# Patient Record
Sex: Male | Born: 1935 | Race: Black or African American | Hispanic: No | Marital: Married | Smoking: Current every day smoker
Health system: Southern US, Community
[De-identification: ages and names within clinical notes are randomized; demographics above are authoritative.]

## PROBLEM LIST (undated history)

## (undated) DIAGNOSIS — C801 Malignant (primary) neoplasm, unspecified: Secondary | ICD-10-CM

## (undated) HISTORY — PX: TRANSURETHRAL RESECTION OF PROSTATE: SHX73

---

## 2001-09-20 ENCOUNTER — Ambulatory Visit (HOSPITAL_COMMUNITY): Admission: RE | Admit: 2001-09-20 | Discharge: 2001-09-20 | Payer: Self-pay | Admitting: Family Medicine

## 2001-09-20 ENCOUNTER — Encounter: Payer: Self-pay | Admitting: Family Medicine

## 2002-12-02 ENCOUNTER — Encounter: Payer: Self-pay | Admitting: Family Medicine

## 2002-12-02 ENCOUNTER — Inpatient Hospital Stay (HOSPITAL_COMMUNITY): Admission: RE | Admit: 2002-12-02 | Discharge: 2002-12-14 | Payer: Self-pay | Admitting: Family Medicine

## 2002-12-06 ENCOUNTER — Encounter: Payer: Self-pay | Admitting: Internal Medicine

## 2003-07-13 ENCOUNTER — Inpatient Hospital Stay (HOSPITAL_COMMUNITY): Admission: RE | Admit: 2003-07-13 | Discharge: 2003-07-15 | Payer: Self-pay | Admitting: Urology

## 2003-08-14 ENCOUNTER — Ambulatory Visit (HOSPITAL_COMMUNITY): Admission: RE | Admit: 2003-08-14 | Discharge: 2003-08-14 | Payer: Self-pay | Admitting: Ophthalmology

## 2004-11-26 ENCOUNTER — Ambulatory Visit (HOSPITAL_COMMUNITY): Admission: RE | Admit: 2004-11-26 | Discharge: 2004-11-26 | Payer: Self-pay | Admitting: General Surgery

## 2006-06-26 ENCOUNTER — Ambulatory Visit (HOSPITAL_COMMUNITY): Admission: RE | Admit: 2006-06-26 | Discharge: 2006-06-26 | Payer: Self-pay | Admitting: Family Medicine

## 2006-07-01 ENCOUNTER — Ambulatory Visit (HOSPITAL_COMMUNITY): Admission: RE | Admit: 2006-07-01 | Discharge: 2006-07-01 | Payer: Self-pay | Admitting: Family Medicine

## 2006-08-20 ENCOUNTER — Ambulatory Visit (HOSPITAL_COMMUNITY): Admission: RE | Admit: 2006-08-20 | Discharge: 2006-08-20 | Payer: Self-pay | Admitting: *Deleted

## 2006-08-28 ENCOUNTER — Inpatient Hospital Stay (HOSPITAL_COMMUNITY): Admission: AD | Admit: 2006-08-28 | Discharge: 2006-08-30 | Payer: Self-pay | Admitting: Urology

## 2006-09-16 ENCOUNTER — Ambulatory Visit (HOSPITAL_COMMUNITY): Admission: RE | Admit: 2006-09-16 | Discharge: 2006-09-16 | Payer: Self-pay | Admitting: Family Medicine

## 2006-09-18 ENCOUNTER — Ambulatory Visit (HOSPITAL_COMMUNITY): Admission: RE | Admit: 2006-09-18 | Discharge: 2006-09-18 | Payer: Self-pay | Admitting: Family Medicine

## 2006-10-06 ENCOUNTER — Ambulatory Visit (HOSPITAL_COMMUNITY): Admission: RE | Admit: 2006-10-06 | Discharge: 2006-10-06 | Payer: Self-pay | Admitting: Family Medicine

## 2006-10-28 ENCOUNTER — Encounter (INDEPENDENT_AMBULATORY_CARE_PROVIDER_SITE_OTHER): Payer: Self-pay | Admitting: *Deleted

## 2006-10-28 ENCOUNTER — Inpatient Hospital Stay (HOSPITAL_COMMUNITY): Admission: RE | Admit: 2006-10-28 | Discharge: 2006-10-31 | Payer: Self-pay | Admitting: Urology

## 2006-12-09 ENCOUNTER — Ambulatory Visit (HOSPITAL_COMMUNITY): Admission: RE | Admit: 2006-12-09 | Discharge: 2006-12-09 | Payer: Self-pay | Admitting: Family Medicine

## 2007-03-31 ENCOUNTER — Ambulatory Visit (HOSPITAL_COMMUNITY): Admission: RE | Admit: 2007-03-31 | Discharge: 2007-03-31 | Payer: Self-pay | Admitting: Urology

## 2008-09-17 IMAGING — RF DG RETROGRADE PYELOGRAM
1 series · 10 of 10 positions shown · non-contrast
Comparison: none

HISTORY: Left hydronephrosis

[Series 1: run · 4 acquisitions, 10 frames shown]
[im 1/4]
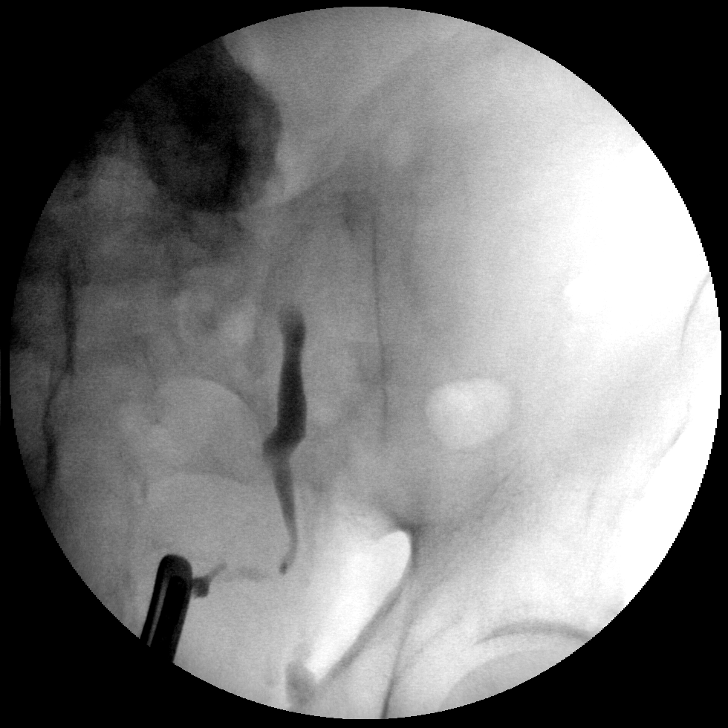
[im 1/4]
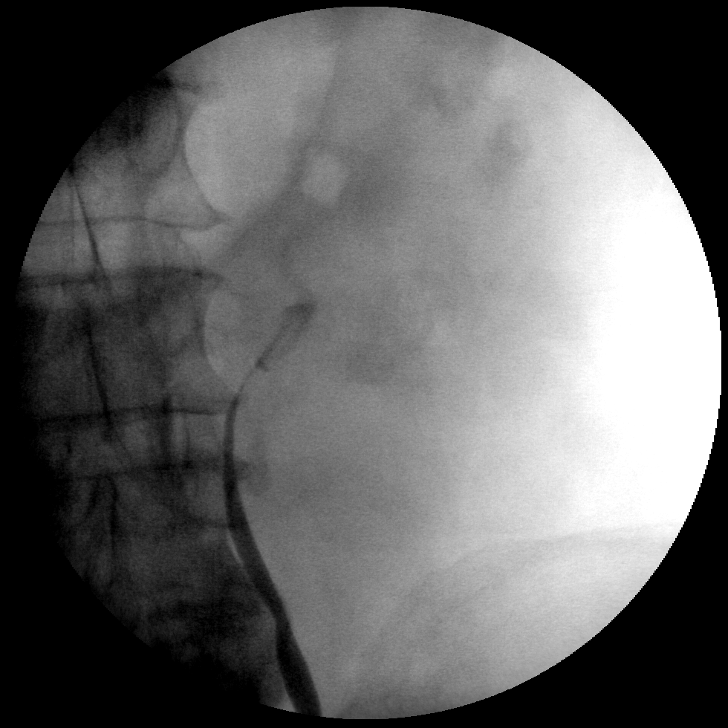
[im 1/4]
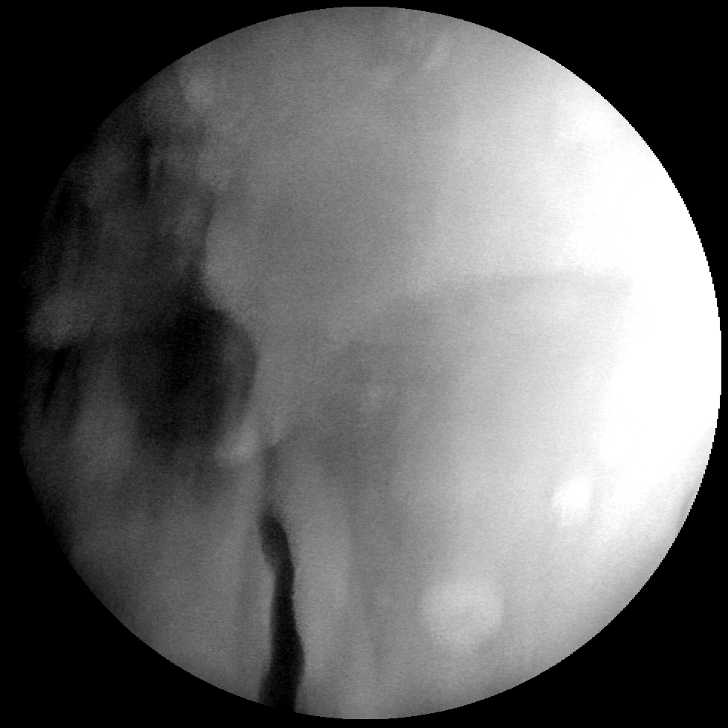
[im 1/4]
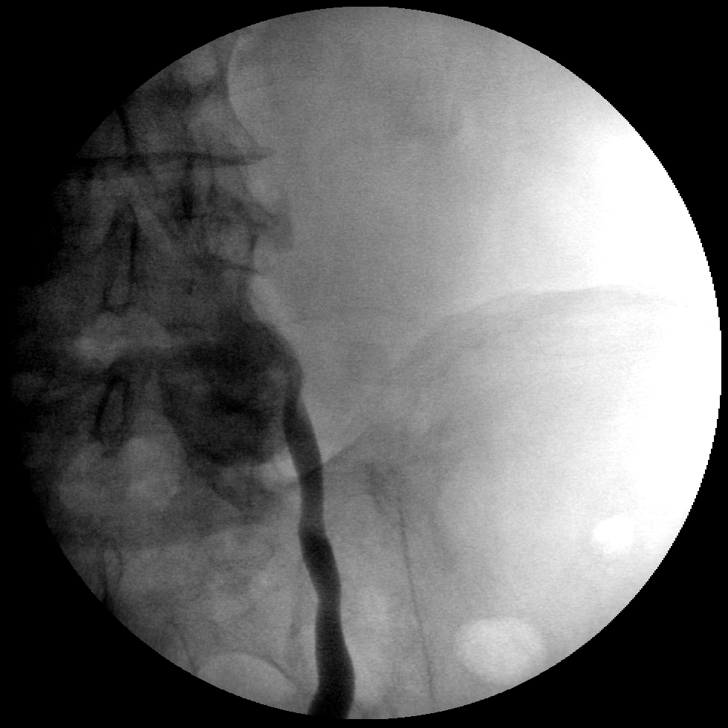
[im 2/4]
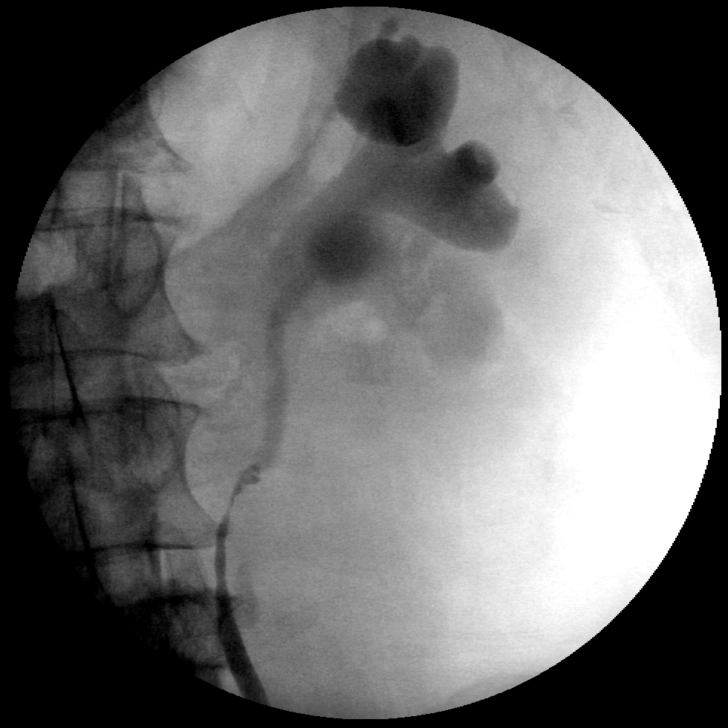
[im 2/4]
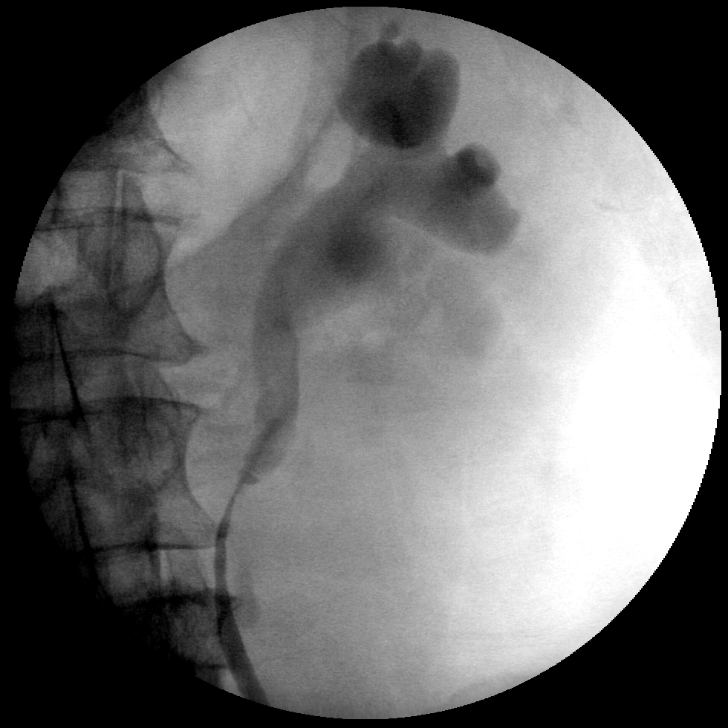
[im 2/4]
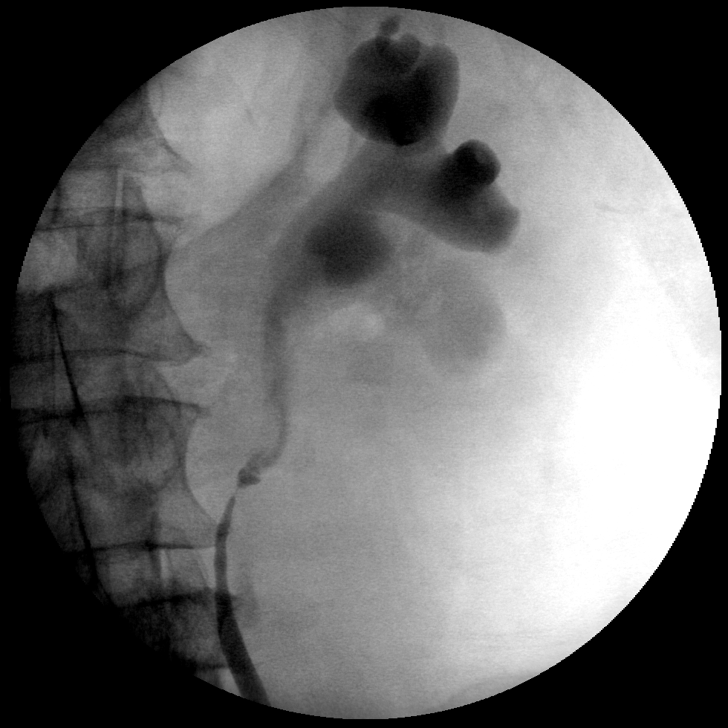
[im 2/4]
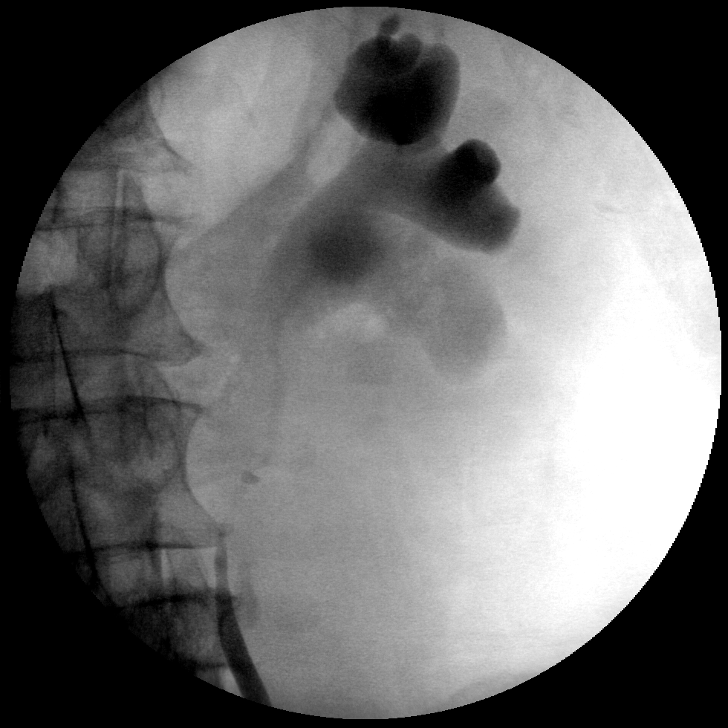
[im 3/4]
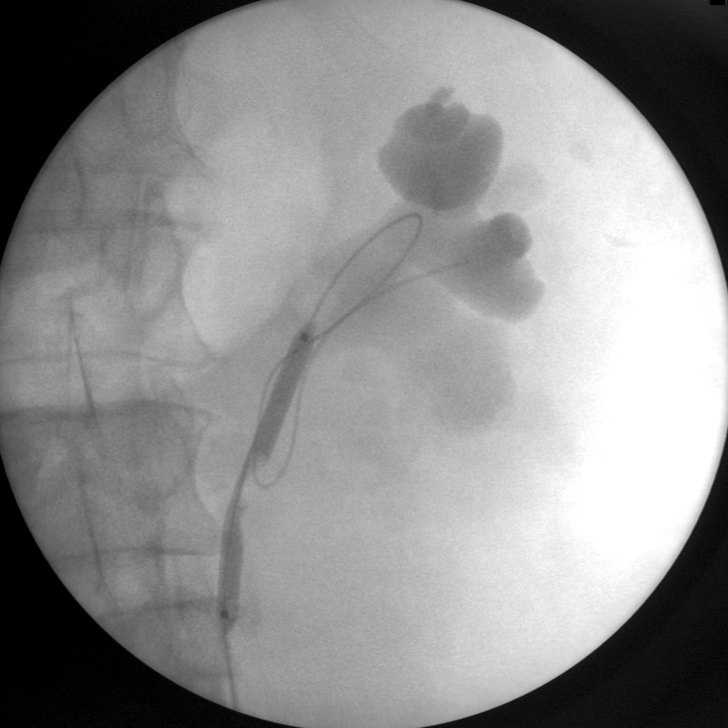
[im 4/4]
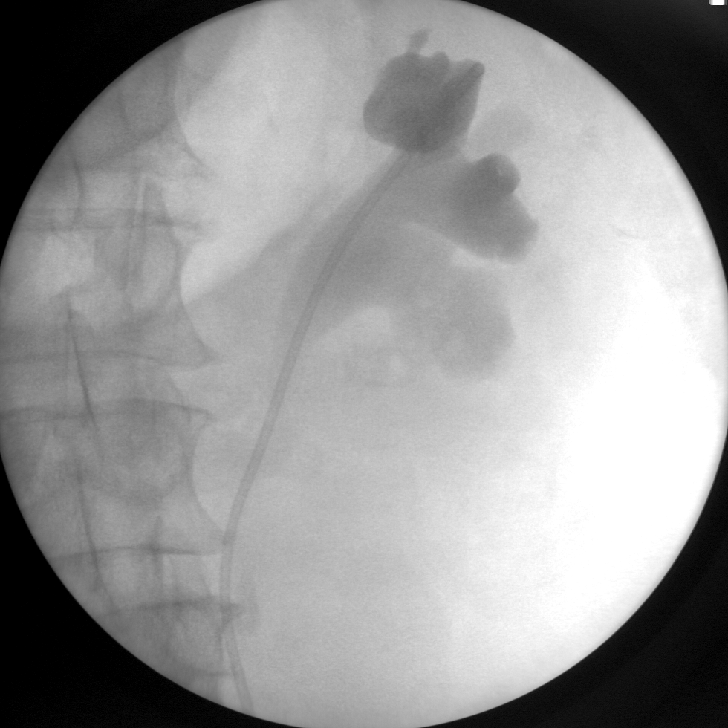

[10 of 10 positions shown; findings below may reference images not displayed]

LEFT RETROGRADE PYELOGRAPHY:

Procedure performed by Dr. Awasthi.
Left retrograde pyelography performed.

Multiple left pelvic phleboliths.
Normal caliber left ureter.
Ureters mildly indented by significant spur formation at L4-L5 level.
No ureteral stricture, wall irregularity, or persistent intraluminal filling
defect.
Left hydronephrosis present secondary to UPJ obstruction.
Images demonstrate stricture or web at the ureteropelvic junction.
This was dilated with a balloon catheter
No definite filling defect or mass seen within the significantly dilated left
renal collecting system. 
Left ureteral stent placed to upper pole of dilated left renal collecting
system.
IMPRESSION: Left UPJ obstruction and stricture which was dilated and stented.
No evidence of stone or mass.

## 2009-07-16 ENCOUNTER — Ambulatory Visit (HOSPITAL_COMMUNITY): Admission: RE | Admit: 2009-07-16 | Discharge: 2009-07-16 | Payer: Self-pay | Admitting: Family Medicine

## 2010-07-14 HISTORY — PX: INSERTION PROSTATE RADIATION SEED: SUR718

## 2010-11-26 NOTE — Discharge Summary (Signed)
NAMEWARREN, [name] NO.:  [id number]   MEDICAL RECORD NO.:  [id number]          PATIENT TYPE:  INP   LOCATION:  A301                          FACILITY:  [facility]   PHYSICIAN:  Dennie Maizes, M.D.   DATE OF BIRTH:  March 05, 1923   DATE OF ADMISSION:  10/28/2006  DATE OF DISCHARGE:  04/19/2008LH                               DISCHARGE SUMMARY   CONSULTING PHYSICIAN:  Mila Homer. Sudie Bailey, M.D.   FINAL DIAGNOSIS:  Benign prostate hypertrophy with bladder neck  obstruction, urinary retention, left hydronephrosis, recurrent urinary  tract infections.   OTHER DIAGNOSES:  1. Hypertension.  2. Hypercholesterolemia.   DISCHARGE SUMMARY:  This 77 year old male is known to me from prior  evaluation and surgery.  He had acute renal failure associated with  bilateral hydronephrosis and bladder neck obstruction in 2005.  Transverse resection of prostate was done.  He had been doing well until  a few months ago.  He seen by Dr. Sudie Bailey with urinary tract infection.  Evaluation revealed left hydronephrosis due to upper ureteral stricture.  The patient has undergone cystoscopy, left retrograde pyelogram, balloon  dilation of ureteral stricture with stent placement on August 20, 2006.  He developed urinary retention after that.  He was admitted to  the hospital with acute renal failure also with urinary retention.  He  has been on Foley catheter drainage.  Trial of voiding has been done on  several occasions and the patient had failed to void.  He was brought to  the short stay center for transverse resection of the prostate and left  ureteral stent removal.  The patient also had a CT scan of abdomen.  This revealed a mass in the left psoas area, possibly due to  inflammation.  Further follow-up with repeat CT scan has been planned.  The patient did not have any fever, chills, voiding difficulty or gross  hematuria at the time of hospitalization.   PAST MEDICAL HISTORY:  1.  History of acute renal failure with bilateral hydronephrosis in      2005.  2. Status post TURP in January 2005.  3. History of hypercholesterolemia.  4. Erectile dysfunction.  5. Hypertension.  6. Status post cystoscopy, left retrograde pyelogram balloon dilation      of the stricture and left ureteral stent placement February 2008.   MEDICATIONS:  1. Lipitor 20 mg one p.o. daily.  2. Cardura 8 mg p.o. daily.  3. Cialis p.r.n. for impotence.  4. Enteric-coated aspirin 81 mg p.o. daily which has been stopped for      the surgery.   ALLERGIES:  NONE.   PHYSICAL EXAMINATION:  HEAD, EYES, EARS, NOSE AND THROAT:  Normal.  NECK:  No masses.  LUNGS:  Clear to auscultation.  HEART:  Regular rate and rhythm.  No murmurs.  ABDOMEN:  No palpable flank mass or CVA tenderness.  Bladder is not  palpable.  GU:  Testes normal.  There is a small left hydrocele.  RECTAL:  43 g benign prostate.   COURSE IN THE HOSPITAL:  Preop labs within normal range.  The patient  was taken to the OR on October 28, 2006.  Under spinal anesthesia,  transverse resection of prostate as well as removal of left ureteral  stent were done.  The patient had an uncomplicated postoperative course.  He was seen by Dr. Sudie Bailey for follow-up and management of his medical  problems.  The first postoperative day, the urine cleared up and the  bladder irrigation was discontinued.  Postop labs revealed BUN 16,  creatinine 1.7.  WBC 8.7, hemoglobin 11.9, hematocrit 34.7.   The second postoperative day, the Foley catheter was removed.  The  patient was unable to void small amounts of urine.  He was kept under  observation.  The Foley catheter was reinserted as he had difficulty in  voiding.   The third postoperative day, the patient was doing well.  He was  afebrile with stable vital signs.  He was discharged and sent home on  October 31, 2006.  He will be reviewed in the office on November 09, 2006, at  which time a trial of  voiding will be done.  The condition of the  patient at the time of discharge is stable.  The pathology of the  prostate revealed benign prostate hyperplasia.  There was no evidence of  malignancy.   DISCHARGE MEDICATIONS:  1. Cipro 5 p.o. b.i.d. for 7 days.  2. Percocet 5/325 one p.o. nightly p.r.n. pain #20.   The patient was advised to call me for any fever, chills or bleeding.      Dennie Maizes, M.D.  Electronically Signed     SK/MEDQ  D:  12/04/2006  T:  12/04/2006  Job:  [id number]   cc:   Mila Homer. Sudie Bailey, M.D.  Fax: [phone]

## 2010-11-29 NOTE — Group Therapy Note (Signed)
NAMENALU, TROUBLEFIELD NO.:  [id number]   MEDICAL RECORD NO.:  [id number]          PATIENT TYPE:  INP   LOCATION:  A301                          FACILITY:  [facility]   PHYSICIAN:  Mila Homer. Sudie Bailey, M.D.DATE OF BIRTH:  Aug 22, 1922   DATE OF PROCEDURE:  DATE OF DISCHARGE:                                 PROGRESS NOTE   He said in general he is feeling better today.  He tells me urology that  he feels he might be able to be discharged tomorrow.   Objectively, he is somewhat recumbent in bed.  He is oriented and alert  and in no acute distress.  Well developed, well nourished.  Temperature  is 99 degrees, pulse 68, respiratory rate 20, blood pressure 121/77.  His lungs are clear throughout.  He is breathing well.  There are no  intercostal retractions.  There is no use of accessory muscles for  respirations.  The heart is now with regular rhythm without murmur.  Rate was 70.  The abdomen is soft without organomegaly or masses.  There  was no edema of the ankles.  Skin turgor is normal.  Mucous membranes  are moist.  O2 sat is 98% on 2 L and H&H are 11.9 and 34.7 with normal  white cell count and normal met 7.   ASSESSMENT:  1. Postop day 2 from transurethral resection of the prostate.  2. Benign prostatic hypertrophy.  3. Benign essentially hypertension.  4. Diabetes.  Weight and diet controlled.  5. Left ureteral stenosis, status post wound dilatation and stent      placement and now with stent removal.   PLAN:  Continue with his bladder irrigation.  All signs being good.  We  will discontinue his O2 today since his sats have been excellent and he  is not on O2 at home.      Mila Homer. Sudie Bailey, M.D.  Electronically Signed     SDK/MEDQ  D:  10/29/2006  T:  10/29/2006  Job:  [id number]

## 2010-11-29 NOTE — Discharge Summary (Signed)
NAME:  [name], [name]                         ACCOUNT NO.:  [id number]   MEDICAL RECORD NO.:  [id number]                   PATIENT TYPE:  INP   LOCATION:  A216                                 FACILITY:  [facility]   PHYSICIAN:  Hanley Hays. Dechurch, M.D.           DATE OF BIRTH:  April 28, 1923   DATE OF ADMISSION:  12/02/2002  DATE OF DISCHARGE:  12/14/2002                                 DISCHARGE SUMMARY   DISCHARGE DIAGNOSES:  1. Acute renal failure secondary to obstructive uropathy.  2. Acute pulmonary embolism.  3. Diarrhea, resolved.   DISPOSITION:  The patient discharged to home.   FOLLOW UP:  By urology 12/23/2002 as well as Dr. Sudie Bailey one month.  INR  12/16/2002 with results to Dr. Josefine Class in Dr. Michelle Nasuti absence.  Home  health nursing to assist with Foley management.  Follow up with Dr. Kristian Covey  to be arranged by patient.   MEDICATIONS:  1. Coumadin 5 mg daily.  2. Flomax 0.4 at bedtime.   DISCHARGE INSTRUCTIONS:  INR 12/16/2002.  The patient advised to avoid  nonsteroidal agents and may use Tylenol p.r.n. pain.   HOSPITAL COURSE:  The patient is a 77 year old healthy gentleman who was in  his usual state of health until several days prior to admission when he  noted some abdominal pain and diarrhea.  Several days prior to admission, he  noted decreased urinary output.  BUN at the time of admission was 70 with a  creatinine of 10.5.  CT scan performed revealed bilateral hydroureter with  hydronephrosis.  A Foley catheter was placed.  Nephrology and urology  consultations were obtained.  TSH 4.9.  His renal function returned to  normal.  On the fourth hospital day, the patient complained of some chest  pain.  D-dimer was greater than 20.  CT scan revealed grossly positive exam  with large embolus at the bifurcation of the right pulmonary artery.  Despite this finding, he actually was otherwise fairly asymptomatic.  He was  immediately treated with low-molecular weight  heparin.  Coumadin was  instituted.  His INR at the time of discharge was 2.6.  Followup as noted  above.  He is in stable condition.  The plan was discussed with his wife and  the patient.  Pharmacy provided Coumadin teaching.   PHYSICAL EXAMINATION:  GENERAL:  Well-developed, well-nourished gentleman  who appears his stated age.  VITAL SIGNS:  Blood pressure 135/70, T max 99, pulse 70s and regular,  respirations unlabored.  LUNGS:  Clear to auscultation anteriorly and posteriorly.  HEART:  Regular rate and rhythm.  No murmurs, rubs, or gallops.  ABDOMEN:  Flat, soft, nontender.  The Foley is draining clear yellow urine.  EXTREMITIES:  Without clubbing, cyanosis, or edema.  NEUROLOGIC:  Intact.   ASSESSMENT AND PLAN:  As noted above.  It should be noted that the patient  was treated with seven days of Levaquin.  Hanley Hays Josefine Class, M.D.    FED/MEDQ  D:  12/14/2002  T:  12/14/2002  Job:  [id number]   cc:   Jorja Loa, M.D.  [address]  [place]  [place] [address]  Fax: [phone]   Dennie Maizes, M.D.  [address]  [place]  [place] [address]  Fax: [phone]   Mila Homer. Sudie Bailey, M.D.  [address] [place], [place] [address]  Fax: [phone]

## 2010-11-29 NOTE — Consult Note (Signed)
NAME:  [name], [name]                         ACCOUNT NO.:  [id number]   MEDICAL RECORD NO.:  [id number]                   PATIENT TYPE:  INP   LOCATION:  A216                                 FACILITY:  [facility]   PHYSICIAN:  Jorja Loa, M.D.             DATE OF BIRTH:  01-26-1923   DATE OF CONSULTATION:  DATE OF DISCHARGE:                                   CONSULTATION   REASON FOR CONSULTATION:  Renal insufficiency.   HISTORY:  Mr. [name] is a 77 year old male with significant past medical  history.  Went to Dr. Sudie Bennett with complaints of decreased appetite,  feeling weak and difficulty in urination and abdominal distention.  According to the patient the last 3-4 days initially he was having some  diarrhea then was followed by a decreased appetite and decreased urine  output and generalized weakness.  Patient gets up.  He used to have  occasional intermittent problem with his urination otherwise he had never  had any history of renal failure or kidney stone.   PAST MEDICAL HISTORY:  He has BPH otherwise no other history.  He denies any  history of hypertension, history of diabetes, no renal failure,  no coronary  artery disease.   PAST SURGICAL HISTORY:  He had surgery.   ALLERGIES:  No known allergies.   MEDICATIONS:  Patient is not on any medication presently.  He is on IV  fluids normal saline at 125 cc/hr.   REVIEW OF SYSTEMS:  No fever, no chills, no sweating.  Feels thirsty in  general.  No nausea at this moment.  No vomiting.  But poor appetite.  No  shortness of breath.  No dizziness.  Abdominal distention __________  .  No  swelling of his legs.   PHYSICAL EXAMINATION:  VITAL SIGNS:  His temperature is 97, blood pressure  is 207/100, temperature 95, pulse of 20.  HEENT:  No conjunctival pallor.  Nonicteric.  Muddy sclerae.  NECK:  Supple.  No JVD.  CHEST:  Clear to auscultation.  No rales.  No rhonchi.  HEART:  Reveals a regular, rate and rhythm.  No  murmur.  ABDOMEN:  Soft.  Positive bowel sounds.  EXTREMITIES:  No edema.   LABORATORY STUDIES:  His blood work which was done today at Dr. Michelle Bennett  office.  His was white blood cell count was 15.6, hemoglobin is 17.5,  hematocrit 50.9 and the platelets were up to 78.  Sodium 164, potassium 4.3,  CO2 of 21, BUN 70 and creatinine of 10.1.  His LFTs are normal.  Amylase is  89 and lipase of 43.   He had an ultrasound which showed bilateral hydronephrosis.  Dr. Rito Bennett  was involved because of that.  He put a Foley catheter and the patient had  significant urine output.    ASSESSMENT:  1. Obstructive uropathy.  BUN and creatinine seem to be somewhat high at  this moment.  No previous history of renal insufficiency since this is     presumed to be acute.  His potassium is normal.  CO2 is normal.  2. Hypertension.  No previous history.  BP seems to be somewhat high.  At     this moment patient is not on any medication.  As stated above the     patient also did not have any other problems from before.  Hence not sure     whether this is transient or whether the patient has a longstanding     history of hypertension.  3. History of benign prostatic hypertrophy.  The patient seems to have some     problem with his urination before.   RECOMMENDATIONS:  I agree with insertion of Foley catheter.  I will hydrate  him and at this moment possibly we may put him on Clonidine and see if his  blood pressure improves.  I will continue that or probably will start him on  antihypertensive medication.  Will follow electrolytes and if his potassium  drops will probably give him some supplement.                                                Jorja Loa, M.D.    BB/MEDQ  D:  12/02/2002  T:  12/02/2002  Job:  [id number]

## 2010-11-29 NOTE — Op Note (Signed)
NAME:  [name], [name]                         ACCOUNT NO.:  [id number]   MEDICAL RECORD NO.:  [id number]                   PATIENT TYPE:  AMB   LOCATION:  DAY                                  FACILITY:  [facility]   PHYSICIAN:  Dennie Maizes, M.D.                DATE OF BIRTH:  12-Jan-1923   DATE OF PROCEDURE:  07/13/2003  DATE OF DISCHARGE:                                 OPERATIVE REPORT   PREOPERATIVE DIAGNOSES:  1. Urinary retention.  2. Benign prostatic hypertrophy with bladder neck obstruction.   POSTOPERATIVE DIAGNOSES:  1. Urinary retention.  2. Benign prostatic hypertrophy with bladder neck obstruction.   PROCEDURE:  Transurethral resection of the prostate.   SURGEON:  Dennie Maizes, M.D.   ANESTHESIA:  Spinal.   COMPLICATIONS:  None.   ESTIMATED BLOOD LOSS:  200 mL.   DRAINS:  A 22 French triple lumen Foley catheter with 30 cc balloon in the  bladder.   INDICATIONS FOR PROCEDURE:  This 77 year old male developed urinary  retention due to prostatic hypertrophy.  He as unable to void after removal  of the catheter.  After counseling, he was brought to the [facility] for  transurethral resection of the prostate and postoperative admission.   DESCRIPTION OF PROCEDURE:  Spinal anesthesia was induced and the patient was  placed on the OR table in the dorsal lithotomy position.  The indwelling  Foley catheter was removed.  The lower abdomen and genitalia were prepped  and draped in a sterile fashion.  Cystoscopy was done with the 25 French  cystoscope.  The urethra was normal.  There moderate hypertrophy involving  both lateral lobes or prostate with anatomical obstruction of the bladder  neck area.  There was asymmetrical enlargement of the prostate.  The left  lateral lobe was larger than the right side. The bladder was then examined  and found to be normal.  There was no abnormality inside the bladder.   A 28 French Iglesias resectoscope with continuous bladder  irrigation was  then inserted into the bladder.  The obstructing adenoma between the bladder  neck and verumontanum was resected first. The right and left lateral lobes  were then resected up to the level of the capsule.  A final resection was  done in the anterior midline area.  The prostatic fossa was then closely  examined and complete hemostasis was obtained by cauterization.  The  prostatic chips were removed and sent for histopathological examination.  The resectoscope was removed.   A 22 French triple lumen Foley catheter with 30 cc balloon was inserted into  the bladder.  Continuous bladder irrigation was started and the returns are  clear.  The patient was transferred to the PACU in a satisfactory condition.      ___________________________________________  Dennie Maizes, M.D.   SK/MEDQ  D:  07/13/2003  T:  07/13/2003  Job:  [id number]   cc:   Mila Homer. Sudie Bailey, M.D.  [address] [place], [place] [address]  Fax: [phone]

## 2010-11-29 NOTE — H&P (Signed)
NAMEKEVORK, JOYCE NO.:  [id number]   MEDICAL RECORD NO.:  [id number]          PATIENT TYPE:  AMB   LOCATION:                                 FACILITY:   PHYSICIAN:  Dalia Heading, M.D.  DATE OF BIRTH:  December 19, 1922   DATE OF ADMISSION:  DATE OF DISCHARGE:  LH                                HISTORY & PHYSICAL   CHIEF COMPLAINT:  Need for screening colonoscopy.   HISTORY OF PRESENT ILLNESS:  The patient is an 77 year old black male who is  referred for endoscopic evaluation.  He needs a colonoscopy for screening  purposes.  No abdominal pain, weight loss, nausea, vomiting, diarrhea,  constipation, melena or hematochezia have been noted.  He has never had a  colonoscopy.  There is no family history of colon carcinoma.   PAST MEDICAL HISTORY:  Benign prostatic hypertrophy.   PAST SURGICAL HISTORY:  Prostate surgery.   CURRENT MEDICATIONS:  Doxazosin.   ALLERGIES:  No known drug allergies.   REVIEW OF SYSTEMS:  The patient smokes daily.  He denies any alcohol use.  He denies any other cardiopulmonary difficulties or bleeding disorders.   PHYSICAL EXAMINATION:  GENERAL APPEARANCE:  The patient is a well-developed,  well-nourished, black male in no acute distress.  VITAL SIGNS:  He is afebrile and vital signs are stable.  LUNGS:  Clear to auscultation with equal breath sounds bilaterally.  HEART:  Regular rate and rhythm without S3, S4 or murmurs.  ABDOMEN:  Soft, nontender and nondistended.  No hepatosplenomegaly or masses  are noted.  RECTAL:  Examination was deferred to the procedure.   IMPRESSION:  Need for screening colonoscopy.   PLAN:  The patient is scheduled for a colonoscopy on Nov 26, 2004.  The  risks and benefits of the procedure, including bleeding and perforation were  full explained to the patient, who gave informed consent.      MAJ/MEDQ  D:  11/12/2004  T:  11/12/2004  Job:  [id number]   cc:   [facility]  Fax: [phone]   Mila Homer. Sudie Bailey, M.D.  [address] [place], [place] [address]  Fax: [phone]

## 2010-11-29 NOTE — Group Therapy Note (Signed)
[name] NO.:  [id number]   MEDICAL RECORD NO.:  [id number]          PATIENT TYPE:  INP   LOCATION:  A301                          FACILITY:  [facility]   PHYSICIAN:  Mila Homer. Sudie Bailey, M.D.DATE OF BIRTH:  04-May-1923   DATE OF PROCEDURE:  DATE OF DISCHARGE:                                 PROGRESS NOTE   PROGRESS NOTE   SUBJECTIVE:  He said that he feels fine now.  His catheter is out and he  is urinating normally.   OBJECTIVE:  GENERAL:  He is seen up in a chair.  VITAL SIGNS:  Temperature is 99.2, pulse 81, respiratory rate 18, blood  pressure 112/60.  O2 sats 98% now on room air.  LUNGS:  Clear throughout.  HEART:  Regular rhythm, rate of 80.  ABDOMEN:  Soft.  EXTREMITIES:  There is no edema of the ankles.  SKIN:  Turgor is normal.   ASSESSMENT:  1. Benign prostatic hyperplasia status post transurethral resection of      the prostate.  2. Hypercholesterolemia.  This is diet controlled.   PLAN:  He will be able to stop the Flomax now.  He will stay off the  aspirin until authorized at follow-up with Dr. Rito Ehrlich.  Discussed with  him.  Probably will be able to go  home today.      Mila Homer. Sudie Bailey, M.D.  Electronically Signed     SDK/MEDQ  D:  10/30/2006  T:  10/30/2006  Job:  [id number]

## 2010-11-29 NOTE — H&P (Signed)
NAME:  [name], [name]                         ACCOUNT NO.:  [id number]   MEDICAL RECORD NO.:  [id number]                   PATIENT TYPE:  AMB   LOCATION:  DAY                                  FACILITY:  [facility]   PHYSICIAN:  Dennie Maizes, M.D.                DATE OF BIRTH:  1922-10-23   DATE OF ADMISSION:  07/12/2004  DATE OF DISCHARGE:                                HISTORY & PHYSICAL   CHIEF COMPLAINT:  Urinary retention, BPH with bladder neck obstruction.   HISTORY OF PRESENT ILLNESS:  This 77 year old male was admitted to the  hospital with lower abdominal pain, voiding difficulty, nausea, vomiting,  and hiccoughs in May 2004.  He was found to be in acute renal failure.  A  Foley catheter was inserted into the bladder and a post void urine of 1200  cc was noted.  The patient's acute renal failure resolved with Foley  catheter drainage.  Prior to the onset of acute urinary retention he had  urinary frequency x3-4 and nocturia x1.  He has a fair urinary stream.  There is no past history of gross hematuria, urolithiasis and urinary tract  infection.   During the hospitalization, at that time the patient developed an acute  pulmonary embolus and he has been on Coumadin for this.  His Coumadin has  recently been discontinued.  The patient was unable to void with trial of  voiding.  After counseling he was brought to the day hospital today for  transurethral resection of the prostate.   PAST MEDICAL HISTORY:  History of acute on chronic urinary retention in May  2004, history of acute pulmonary embolus, acute renal failure (resolved).   MEDICATIONS:  Coumadin 5 mg 1 p.o. daily which has been discontinued.   ALLERGIES:  None.   OPERATIONS:  None.   PHYSICAL EXAMINATION:  HEAD, EYES, EARS, NOSE, AND THROAT:  Normal.  NECK:  No masses.  LUNGS:  Clear to auscultation.  HEART:  Regular rate and rhythm no murmurs.  ABDOMEN:  Soft, no palpable flank mass, no CVA tenderness.  Bladder  not  palpable.  Foley catheter is draining clear urine.  GENITOURINARY:  Penis normal.  Testes are normal.  There is a small left  hydrocele.  RECTAL:  Examination reveals a large benign prostate.   IMPRESSION:  1. Urinary retention.  2. Benign prostatic hypertrophy with bladder neck obstruction.   PLAN:  Transurethral resection of the prostate under [facility] in [facility].  I have discussed with the patient and his family regarding the  diagnosis, operative details, alternative treatments, outcome and possible  risks and complications and he has agreed for the procedure to be done.     ___________________________________________  Dennie Maizes, M.D.   SK/MEDQ  D:  07/12/2003  T:  07/12/2003  Job:  [id number]   cc:   Mila Homer. Sudie Bailey, M.D.  [address] [place], [place] [address]  Fax: [phone]

## 2010-11-29 NOTE — H&P (Signed)
NAMELEEVI, CULLARS NO.:  [id number]   MEDICAL RECORD NO.:  [id number]          PATIENT TYPE:  AMB   LOCATION:  DAY                           FACILITY:  [facility]   PHYSICIAN:  Dennie Maizes, M.D.   DATE OF BIRTH:  04/08/1923   DATE OF ADMISSION:  08/20/2006  DATE OF DISCHARGE:  LH                              HISTORY & PHYSICAL   CHIEF COMPLAINT:  Left hydronephrosis, urinary tract infection, enlarged  prostate.   HISTORY OF PRESENT ILLNESS:  This 77 year old male is known to me from  prior evaluation and surgery.  He had acute renal failure, bilateral  hydronephrosis and urinary retention due to BPH in 2005.  He had  undergone transurethral resection of the prostate at that time.  He had  been doing well until recently.  He was recently seen by Dr. Sudie Bailey in  his office with symptoms suggestive of urinary tract infection.  Urine  culture was positive and he has been treated with a course of  antibiotics.   He also has prostatic obstructive symptoms for which he has been taking  Flomax with good results.  He has been evaluated with a renal ultrasound  as well as a CT scan of the abdomen and pelvis.  These x-rays have  reviewed mild to moderate left hydronephrosis with obstruction at the  ureteropelvic junction.  Enlarged prostate has been noted.   He has good urinary flow, urinary frequency x3-4 and nocturia x1.  He is  not having any fevers, chills, voiding difficulty or dysuria.  He had  intermittent mild hematuria once 2 months ago.  The urinary symptoms are  improved with Flomax therapy.   PAST MEDICAL HISTORY:  1. History of acute renal failure.  2. Bilateral hydronephrosis.  3. Urinary retention, status post TURP in January 2005.  4. History of hypercholesterolemia.  5. Erectile dysfunction.  6. Hypertension.   MEDICATIONS:  1. Lipitor 20 mg one p.o. daily.  2. Cardura 8 mg one p.o. daily.  3. Enteric-coated aspirin 81 mg one p.o. daily.  4.  Cialis p.r.n. for impotence.   ALLERGIES:  None.   EXAMINATION:  VITAL SIGNS:  Weight 180 pounds.  HEENT:  Normal.  NECK:  No masses.  LUNGS:  Clear to auscultation.  HEART:  Regular rate and rhythm, no murmurs.  ABDOMEN:  Soft.  GU:  No palpable flank mass or costovertebral angle tenderness.  Bladder  is not palpable.  Penis normal.  Testes are normal.  There is a small  left hydrocele.  RECTAL:  A 50-g-size benign prostate.   IMPRESSION:  1. Left hydronephrosis.  2. Urinary tract infection.  3. Benign prostatic hypertrophy with bladder neck obstruction.  4. Impotence.   PLAN:  Cystoscopy, left retrograde pyelogram under anesthesia in [facility].  I have discussed with the patient regarding the diagnoses,  operative details, alternate treatment, outcome, possible risks and  complications and he has agreed for the procedure to be done.      Dennie Maizes, M.D.  Electronically Signed     SK/MEDQ  D:  08/20/2006  T:  08/20/2006  Job:  [id number]   cc:   Mila Homer. Sudie Bailey, M.D.  Fax: [phone]

## 2010-11-29 NOTE — Op Note (Signed)
NAMEBRINDEN, KINCHELOE NO.:  [id number]   MEDICAL RECORD NO.:  [id number]          PATIENT TYPE:  INP   LOCATION:  A301                          FACILITY:  [facility]   PHYSICIAN:  Dennie Maizes, M.D.   DATE OF BIRTH:  [birth year]   DATE OF PROCEDURE:  10/28/2006  DATE OF DISCHARGE:                               OPERATIVE REPORT   PREOPERATIVE DIAGNOSES:  1. Urinary retention.  2. Benign prostate hypertrophy with bladder neck obstruction.  3. Left hydronephrosis.  4. Left ureteral stricture.   POSTOPERATIVE DIAGNOSES:  1. Urinary retention.  2. Benign prostate hypertrophy with bladder neck obstruction.  3. Left hydronephrosis.  4. Left ureteral stricture.   OPERATIVE PROCEDURE:  1. Cystoscopy.  2. Removal of left ureteral stent.  3. Transurethral resection of the prostate.   ANESTHESIA:  Spinal.   SURGEON:  Dennie Maizes, M.D.   COMPLICATIONS:  None.   ESTIMATED BLOOD LOSS:  100 mL.   DRAINS:  A 22-French triple lumen Foley catheter with a 30 mL balloon in  the bladder.   SPECIMEN:  Prostate chips sent to Pathology.  Left ureteral stent which  was discarded.   INDICATIONS FOR PROCEDURE:  This 84-year male had recurrent urinary  tract infection.  Evaluation revealed left hydronephrosis due to upper  ureteral stricture.  Cystoscopy, left retrograde pyelogram, balloon  dilation of the stricture and stent placement were done.  The patient  developed acute urinary retention after the procedure.  He failed to  void on several occasions.  The patient was brought to the OR today for  cystoscopy, removal of the left ureteral stent, transurethral resection  of the prostate.   DESCRIPTION OF PROCEDURE:  Spinal anesthesia was induced and the patient  was placed on the OR table in the dorsal lithotomy position.  The lower  abdomen and genitalia were prepped and draped in a sterile fashion.  Cystoscopy was done with a 25-French scope.  The urethra was normal.  There was moderate hypertrophy of the prostate involving both lateral  lobes.  There was asymmetrical enlargement of left lateral lobe which  was projecting into the lumen of prostate urethra.  There was evidence  of previous TURP.  The bladder was examined and found to be normal.  The  lower end of the stent was then held with the grasping forceps and  removed without any difficulty.   The urethra was dilated to 30-French with Sissy Hoff sounds.  A 27 French  Iglesias resectoscope with continuous bladder irrigation was then  inserted into the bladder.  Right lateral lobe was resected first up to  the level of capsule.  The left lateral lobe was then resected.  Resection was done in the 6 and 12 o'clock positions.  The prostatic  fossa was then closely examined and complete hemostasis was obtained by  cauterization with the rollerball electrode.  There was no active  bleeding at this time.  Estimated blood loss was about 100 mL.  Prostate  chips were then removed from the bladder and sent for histopathological  examination.  A 22-French triple lumen Foley  catheter with 30 mL balloon  was inserted into the bladder.  Continuous bladder irrigation was  started and  the returns were clear.  The patient was transferred to the  PACU in satisfactory condition.      Dennie Maizes, M.D.  Electronically Signed     SK/MEDQ  D:  10/28/2006  T:  10/28/2006  Job:  [id number]   cc:   Clearance Coots, M.D.  Fax: [phone]

## 2010-11-29 NOTE — H&P (Signed)
[name], BUCKLIN NO.:  [id number]   MEDICAL RECORD NO.:  [id number]          PATIENT TYPE:  INP   LOCATION:  A329                          FACILITY:  [facility]   PHYSICIAN:  Dennie Maizes, M.D.   DATE OF BIRTH:  1922-12-10   DATE OF ADMISSION:  08/28/2006  DATE OF DISCHARGE:  LH                              HISTORY & PHYSICAL   CHIEF COMPLAINT:  Voiding difficulty, urinary incontinence, urinary  retention, dehydration.   HISTORY OF PRESENT ILLNESS:  This 77 year old male is known to me from  prior evaluation and surgery. He had acute renal failure associated with  bilateral hydronephrosis and prostate obstruction in 2005. Transverse  resection of prostate was done. He has been doing well until recently.  He was seen recently by Dr. Sudie Bailey. His symptoms are suggestive of  urinary tract infection. Urine culture was positive, and he was treated  with IV course of antibiotics. He has, second, prostate obstruction  syndrome for which he has been taking Flomax with good results. Renal  ultrasound as well as CT scan of abdomen and pelvis revealed moderate  left hydronephrosis with obstruction at the level of ureteropelvic  junction. Enlarged prostate was also noted.   The patient was taken to the OR on August 20, 2006. Cystoscopy, left  retrograde pyelogram were done. This revealed a left upper ureteral  stricture at the level of the ureteropelvic junction area. Balloon  dilation of stricture and stent placement were done. The patient has  been voiding well after that. For the past few days he has been having  some voiding difficulty associated with urinary leakage. He is also  dehydrated. He is unable to eat and drink well. The patient was seen in  the office with bladder distension. A Foley catheter was inserted, and  1000 cc of blood-stained urine was drained. The patient was admitted to  the hospital for IV fluid replacement, correction of dehydration, and  further treatment.   PAST MEDICAL HISTORY:  History of acute renal failure, bilateral  hydronephrosis, urinary retention, status post TURP in January 2005,  history of hypercholesterolemia, erectile dysfunction or hypertension.   MEDICATIONS:  1. Lipitor 20 mg one p.o. daily.  2. Cardura 8 mg one p.o. daily.  3. Enteric-coated aspirin 81 mg one p.o. daily which has been stopped.  4. Cialis p.r.n. for impotence.   ALLERGIES:  None.   PHYSICAL EXAMINATION:  HEENT:  Normal.  NECK:  No masses.  LUNGS:  Clear to auscultation.  HEART:  Regular rate and rhythm. No murmurs.  ABDOMEN:  Soft. No palpable flank mass. Bladder is distended up to the  level of umbilicus.  GENITOURINARY:  Testes are normal. There is a small  left hydrocele. The patient was found to be clinically dehydrated.   IMPRESSION:  1. Urinary retention.  2. Benign prostatic hypertrophy with bladder neck obstruction.  3. Left hydronephrosis status post balloon dilation of ureteral      stricture and stent placement.  4. Dehydration.   PLAN:  1. Will admit the patient to the hospital.  2. IV fluids.  3.  Labs. CBC with diff urinalysis, urine culture and sensitivity,      BMET.  4. Medical consult Dr. Sudie Bailey for his medical problems.      Dennie Maizes, M.D.  Electronically Signed     SK/MEDQ  D:  08/28/2006  T:  08/28/2006  Job:  [id number]   cc:   Mila Homer. Sudie Bailey, M.D.  Fax: [phone]

## 2010-11-29 NOTE — H&P (Signed)
   NAME:  [name], [name]                         ACCOUNT NO.:  [id number]   MEDICAL RECORD NO.:  [id number]                   PATIENT TYPE:  INP   LOCATION:  A216                                 FACILITY:  [facility]   PHYSICIAN:  Mila Homer. Sudie Bailey, M.D.           DATE OF BIRTH:  07/01/1923   DATE OF ADMISSION:  12/02/2002  DATE OF DISCHARGE:                                HISTORY & PHYSICAL   HISTORY:  This 77 year old man presented to the office today complaining of  some diarrhea and abdominal pain.  He may have had some fever.  There has  been nausea or vomiting.  Appetite was down some.  He also noted later that  he had been having problems starting his urinary stream but that also  started about four days prior to admission.  He has had  no meds.   PHYSICAL EXAMINATION:  GENERAL:  Showed a pleasant elderly man who did not  seem to be in any acute distress.  He was oriented and alert.  VITAL SIGNS:  Stable in the office and in the hospital temperature 97  degrees, pulse 95, respiratory rate 20, blood pressure 207/100, weight 185  pounds, height 69 inches.  HEENT:  Pupils equal, reactive to light.  EOMs were intact.  Pharynx was  normal except three of the teeth in his upper jaw.  LYMPHATICS:  There are negative anterior cervical nodes.  There is no  axillary or supraclavicular nodes.  HEART:  Regular rhythm without murmur, rate of 70.  LUNGS:  Clear throughout.  ABDOMEN:  Very distended on admission exam in the office less so once he was  at the hospital.  EXTREMITIES:  There is no edema in the ankles.   LABORATORY DATA:  His admission blood work showed a white cell count of  15,600, there were 79% granulocytes and 14 monos.  His H&H were 17.5 and  ____.9.  Serum sodium was 134.  His BUN was 70.  Creatinine 10.5.  LFTs were  normal as were his amylase and lipase.   His abdominal CT showed bilateral hydroureter, hydronephrosis.   ADMISSION DIAGNOSES:  1. Acute urinary  retention, resulting in bilateral hydroureters and     hydronephrosis.  2. Diarrhea, question etiology.   PLAN OF TREATMENT:  Of course include a Foley catheter.  I have discussed  this case with Dr. Rito Ehrlich urologist and also Dr.  Kristian Covey, nephrologist.  Will follow serial MET-7s, CBCs.  He is on IV fluids.                                                Mila Homer. Sudie Bailey, M.D.    SDK/MEDQ  D:  12/02/2002  T:  12/02/2002  Job:  [id number]

## 2010-11-29 NOTE — Op Note (Signed)
NAMEEXAVIER, LINA NO.:  [id number]   MEDICAL RECORD NO.:  [id number]          PATIENT TYPE:  AMB   LOCATION:  DAY                           FACILITY:  [facility]   PHYSICIAN:  Dennie Maizes, M.D.   DATE OF BIRTH:  03-26-1923   DATE OF PROCEDURE:  08/20/2006  DATE OF DISCHARGE:                               OPERATIVE REPORT   PREOPERATIVE DIAGNOSIS:  Left hydronephrosis, urinary tract infection,  enlarged prostate.   POSTOPERATIVE DIAGNOSIS:  Left hydronephrosis, urinary tract infection,  enlarged prostate,  left upper ureteral stricture.   OPERATIVE PROCEDURE:  Cystoscopy, left retrograde pyelogram, balloon  dilation of left upper ureteral stricture, left ureteral stent  placement.   ANESTHESIA:  Spinal.   SURGEON:  Dennie Maizes, M.D.   COMPLICATIONS:  None.   ESTIMATED BLOOD LOSS:  Minimal.   DRAINS:  7 French, 26 cm size left ureteral stent.   INDICATIONS FOR PROCEDURE:  This 77 year old male was treated for a  urinary tract infection.  X-rays were done which revealed a left  hydronephrosis, possibly due to ureteropelvic junction obstruction.  The  patient also had minimal prostate, obstructive symptoms.  He was taken  to the operating room today for cystoscopy, left retrograde pyelogram  and further treatment.   DESCRIPTION OF PROCEDURE:  Spinal anesthesia was induced and the patient  was placed on the OR table in the dorsolithotomy position.  The lower  abdomen and genitalia were prepped and draped in a sterile fashion.  Cystoscopy was done with a 25-French scope.  Urethra was normal.  There  was evidence of previous TURP with minimal regrowth of prostate.  There  was a partial obstruction of bladder neck area.  Bladder was examined  and found to be trabeculated.  Both ureteral orifices and bladder mucosa  were normal.  A 5-French  wedge catheter was then placed the left  ureteral orifice.  About 7 mL of Renografin 60 was injected into the  left collecting system and retrograde pyelogram was done with C-arm  fluoroscopy.  The distal ureter was normal.  There was a small stricture  in the upper ureter with proximal hydroureter and moderate  hydronephrosis.  There was no definite filling defect in the ureter.   A 5-French open-ended catheter was then placed the left distal ureter.  A 0.038 inch Bentson guide with a flexible tip was then advanced into  the left renal pelvis.  The open-ended catheter was then removed.  A 15-  French 6 cm length balloon was then inserted into the left upper ureter  placed.  The stricture was dilated with the C-arm  fluoroscopy guidance.  The balloon dilating catheter was then removed.  A 7 French 26 cm size stent was then inserted into the left collecting  system without any difficulty.  The instruments were removed.  The  patient was transferred to the PACU in a satisfactory condition.      Dennie Maizes, M.D.  Electronically Signed     SK/MEDQ  D:  08/20/2006  T:  08/20/2006  Job:  [id number]   cc:   Jeannett Senior  D. Sudie Bailey, M.D.  Fax: [phone]

## 2010-11-29 NOTE — Group Therapy Note (Signed)
NAMEJOVANNIE, ULIBARRI NO.:  [id number]   MEDICAL RECORD NO.:  [id number]          PATIENT TYPE:  INP   LOCATION:  A329                          FACILITY:  [facility]   PHYSICIAN:  Mila Homer. Sudie Bailey, M.D.DATE OF BIRTH:  01-Oct-1922   DATE OF PROCEDURE:  08/29/2006  DATE OF DISCHARGE:                                 PROGRESS NOTE   SUBJECTIVE:  Patient was admitted to the hospital yesterday by Dr.  Rito Ehrlich, urologist, due to urinary retention of about 1000 cc of blood-  tinged urine.  He feels better today.   In addition to his history of acute renal failure, bilateral  hydronephrosis, urinary retention in the past, status post TURP several  years ago, he has had hypercholesterolemia, essential hypertension, as  well as erectile dysfunction.   Current outpatient meds have included:  1. Lipitor 20 mg daily.  2. Cardura 8 mg daily.  3. Enteric-coated aspirin 81 mg daily.  4. Cialis 20 mg p.r.n.   His wife told me he gradually had been getting worse all this last week.  There has been a problem with meds, and he has been on Flomax 0.4 mg  daily, but this was getting too expensive.  They had gone down to the [place]  just several days ago, and had a script for Cardura.  This had not been  started yet, however.   OBJECTIVE:  Temperature 97.5, pulse 102, respiratory rate 16, blood  pressure 137/65.  Sitting up in a chair.  Patient is oriented and alert.  Family is with  him.  He is in no acute distress, well-developed, well-nourished.  His heart has a regular rhythm rate of about 120 sitting.  The lungs are clear throughout, moving air well.  The abdomen is somewhat distended, but apparently improved.  He has no edema in the ankles.   His admission white cell count is 14,700 of which 92% neutrophils, 5  lymphs, his BUN 35, creatinine 2.36, estimated GFR 32.  His urine showed  7-10 WBCs per HPF, and too numerous to count RBCs and was cloudy.  Cultures pending.   ASSESSMENT:  1. Acute urinary retention.  2. History of bilateral hydronephrosis secondary to BPH, now status      post TURP in 2005.  3. Benign essential hypertension.  4. Hypercholesterolemia.  5. Erectile dysfunction.  6. Probable urinary tract infection.   PLAN:  Continue with Foley catheter, as well as doxazosin 8 mg daily,  simvastatin 4 mg daily.  C and S is pending.  Discussed with patient and  family.      Mila Homer. Sudie Bailey, M.D.  Electronically Signed     SDK/MEDQ  D:  08/29/2006  T:  08/29/2006  Job:  [id number]

## 2010-11-29 NOTE — Consult Note (Signed)
   NAME:  [name], [name]                         ACCOUNT NO.:  [id number]   MEDICAL RECORD NO.:  [id number]                   PATIENT TYPE:  INP   LOCATION:  A216                                 FACILITY:  [facility]   PHYSICIAN:  Dennie Maizes, M.D.                DATE OF BIRTH:  28-Aug-1922   DATE OF CONSULTATION:  DATE OF DISCHARGE:                                   CONSULTATION   REASON FOR CONSULTATION:  Acute renal failure, bilateral hydronephrosis.   CONSULTATION REPORT:  This 77 year old male complained of lower abdominal  pain, lower abdominal distention, diarrhea, nausea, vomiting, difficulty  toward hiccups, and urinary incontinence for about 5 days.  He was seen by  Dr. Sudie Bailey in the office and found to be in acute renal failure with an  elevated BUN of 17 and creatinine of 10.5.  He was admitted to the hospital  and I was asked to see the patient for further evaluation.  Renal ultrasound  done as an outpatient revealed bilateral hydronephrosis.   The patient denied having significant voiding difficulties before.  He had  urinary frequency x3-4, nocturia x1, fair urinary stream.  There was no  history of gross hematuria, urolithiasis, or urinary tract infections.   PAST MEDICAL HISTORY:  Unremarkable.  No medical illnesses.   MEDICATIONS:  None.   ALLERGIES:  None.   OPERATIONS:  None.   EXAMINATION:  ABDOMEN:  Soft.  No palpable or flat mass.  No CVA tenderness.  Bladder was noted to be distended.  GENITALIA:  Penis normal.  Small left hydrocele was noted.  Testes are  normal.  RECTAL:  Examination revealed a large benign prostate.   An 18-French Foley catheter was inserted into the bladder and 1200 mL of  clear urine was drained.  Renal ultrasound revealed bilateral  hydronephrosis.   IMPRESSION:  Acute renal failure, acute on chronic urinary retention,  bilateral prostatic hypertrophy with bladder neck obstruction, bilateral  hydronephrosis.   PLAN:  1.  Observe the patient closely for hematuria and post-obstructive diuresis.  2.     Continue catheter drainage.  3. Nephrology consult for fluid and electrolyte management.   Thanks for this consult.                                                Dennie Maizes, M.D.    SK/MEDQ  D:  12/04/2002  T:  12/04/2002  Job:  [id number]   cc:   Mila Homer. Sudie Bailey, M.D.  [address] [place], [place] [address]  Fax: [phone]

## 2010-11-29 NOTE — Group Therapy Note (Signed)
NAMEADRIEL, KESSEN NO.:  [id number]   MEDICAL RECORD NO.:  [id number]          PATIENT TYPE:  INP   LOCATION:  A301                          FACILITY:  [facility]   PHYSICIAN:  Mila Homer. Sudie Bailey, M.D.DATE OF BIRTH:  02/15/1923   DATE OF PROCEDURE:  DATE OF DISCHARGE:                                 PROGRESS NOTE   SUBJECTIVE:  An 77 year old had a TURP today and I was called in  consultation by Dr. Mellody Drown for his medical problems.  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   He had been on Lipitor 20 mg daily, Flomax 0.4 mg daily until last month  when, after weight loss, his cholesterol came down to normal.  He had a  history of diabetes that cleared with weight loss as well.  Recently had  acute urinary retention, after his stent was placed, has had a Foley  catheter at home since then.   He has also found has some abnormality around his left ureter which is  felt to be probably inflammation although other etiologies were  suggested by radiology.  Currently he is back up in his room in bed  having eaten lunch and has no complaints.   OBJECTIVE:  Temperature 96.9, pulse 65 respiratory rate 20, blood  pressure 140/62.  He is semi-recumbent.  He is well-developed, well-  nourished, oriented, and alert.  SKIN:  Turgor is normal.  Mucous membranes are moist.  Thyroid is normal.  There is no axillary,  supraclavicular adenopathy.  HEART:  Has a regular rhythm rate of about 70.  LUNGS:  Clear throughout.  ABDOMEN:  Soft  without hepatosplenomegaly or mass.  There is no edema of  the ankles.   His admission CBC and met 7 done October 20, 2006 were both normal.  His  recent CT scan of the abdomen showed a left ureteral stent, BPH,  bilateral hydroceles.   ASSESSMENT:  1. Benign prostatic hypertrophy with urinary retention.  2. Left ureteral stenosis status post balloon dilatation and stent      placement.  3. Bilateral hydroceles.  4. A villotubular adenoma of the colon.  5. Hypercholesteremia.  6. Benign essential hypertension.  7. Diabetes currently controlled with weight loss.  8. History of tobacco use disorder.  9. 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  11.Left carotid bruit.   PLAN:  He is getting bladder irrigation now status post TURP.  He will  need to resume his coated aspirin when Urology feels he is able to.  Also need to be up and ambulating when he is surgically able to.  Discussed with patient and wife.  Mila Homer. Sudie Bailey, M.D.  Electronically Signed     SDK/MEDQ  D:  10/28/2006  T:  10/28/2006  Job:  [id number]

## 2010-11-29 NOTE — Group Therapy Note (Signed)
NAMEKEROLOS, NEHME NO.:  [id number]   MEDICAL RECORD NO.:  [id number]          PATIENT TYPE:  INP   LOCATION:  A329                          FACILITY:  [facility]   PHYSICIAN:  Mila Homer. Sudie Bailey, M.D.DATE OF BIRTH:  12-22-1922   DATE OF PROCEDURE:  08/30/2006  DATE OF DISCHARGE:  08/30/2006                                 PROGRESS NOTE   SUBJECTIVE:  The patient says he feels pretty much back to normal today.  The patient is sitting in a semi-recumbent bed, in no acute distress.   PHYSICAL EXAMINATION:  GENERAL:  He is well-developed and well-  nourished, oriented, alert.  His sentence structures are intact.  VITAL SIGNS:  Temperature 97 degrees, pulse 84, respirations 18, blood  pressure 124/62.  LUNGS:  Appear clear throughout.  Moving air well.  HEART:  A regular rhythm with a 2/6 SEM, a rate of 80.  ABDOMEN:  Soft without hepatosplenomegaly or mass or tenderness.  There  is no CVA or flank pain.  EXTREMITIES:  No edema of the ankles.   LABORATORY DATA:  Today's BUN is 23, creatinine 1.68, down from  yesterday's 35/2.36.   ASSESSMENT:  Acute urinary retention, improved with catheter drainage.   PLAN:  Will probably be able to be discharged home tomorrow, but may  need a Foley.  Follow up by Dr. Rito Ehrlich as well as urologist.      Mila Homer. Sudie Bailey, M.D.  Electronically Signed     SDK/MEDQ  D:  08/30/2006  T:  08/30/2006  Job:  [id number]

## 2010-11-29 NOTE — Group Therapy Note (Signed)
   [name], [name]                           ACCOUNT NO.:  [id number]   MEDICAL RECORD NO.:  [id number]                  PATIENT TYPE:   LOCATION:  216                                  FACILITY:   PHYSICIAN:  Angus G. Renard Matter, M.D.              DATE OF BIRTH:   DATE OF PROCEDURE:  DATE OF DISCHARGE:                                   PROGRESS NOTE   HISTORY OF PRESENT ILLNESS:  This patient was admitted in acute urinary  retention, hydroureter, hydronephrosis, diarrhea.  He does have history of  obstructive uropathy, prostatic hypertrophy.  He apparently does have  pulmonary embolization.  He was started on Coumadin.   OBJECTIVE:  VITAL SIGNS:  Blood pressure 135/64, respirations 20, pulse 94,  temperature 98.9.  HEART:  Regular rhythm.  LUNGS:  Clear to P&A.  ABDOMEN:  No palpable organs or masses.   ASSESSMENT:  The patient has had urinary retention, prostatic hypertrophy,  obstructive uropathy, pulmonary embolization and right pulmonary artery.   PLAN:  Continue current regimen.  Lovenox, Coumadin                                               Angus G. Renard Matter, M.D.    AGM/MEDQ  D:  12/08/2002  T:  12/08/2002  Job:  [id number]

## 2010-11-29 NOTE — Discharge Summary (Signed)
NAMEKRISH, BAILLY NO.:  [id number]   MEDICAL RECORD NO.:  [id number]          PATIENT TYPE:  INP   LOCATION:  A329                          FACILITY:  [facility]   PHYSICIAN:  Dennie Maizes, M.D.   DATE OF BIRTH:  18-Feb-1923   DATE OF ADMISSION:  08/28/2006  DATE OF DISCHARGE:  02/17/2008LH                               DISCHARGE SUMMARY   CONSULTANT:  Mila Homer. Sudie Bailey, M.D.   DISCHARGE SUMMARY:  This 77 year old male is known to me from prior  evaluation and surgery.  He had acute renal failure associated with  bilateral hydronephrosis and prostate obstruction in 2005.  A  transurethral resection of the prostate was done.  He had been doing  well until recently.  He was seen recently by Dr. Sudie Bailey.  His  symptoms were suggestive of urinary tract infection.  A urine culture  was positive, and he was treated with a course of IV antibiotics.  He  has prostate obstructive symptoms for which he has been taking Flomax  with good results.  Renal ultrasound as well as CT scan of the abdomen  and pelvis revealed moderate left hydronephrosis with obstruction at the  level of ureteropelvic junction.  Enlarged prostate was also noted.   The patient was taken to the operating room on February7,2008.  Cystoscopy and left retrograde pyelogram were done.  This revealed a  left upper ureteral stricture at the level of ureteropelvic junction  area.  Balloon dilation of the stricture and stent placement were done.  The patient has been voiding well after that.  For the past few days he  has been having some difficulty with the associated urinary leakage.  He  is also dehydrated.  He was unable to eat and drink well.  The patient  was seen in the office with bladder distension.  A Foley catheter was  inserted and 1000 mL of blood stained urine was drained.  The patient  was admitted to the hospital for IV fluids correction of dehydration and  further treatment.   PAST  MEDICAL HISTORY:  1. History of acute renal failure.  2. Bilateral hydronephrosis.  3. Urinary retention status post TURP, January2005.  4. History of hypercholesterolemia.  5. Erectile dysfunction.  6. Hypertension.   MEDICATIONS:  1. Lipitor 20 mg 1 p.o. daily.  2. Cardura 8 mg p.o. daily.  3. Enteric-coated aspirin 81 mg p.o. daily which was stopped.  4. Cialis p.r.n. for impotence.   ALLERGIES:  NONE.   PHYSICAL EXAMINATION:  HEENT:  Normal.  NECK:  No masses.  LUNGS:  Clear to auscultation.  HEART:  Regular rate and rhythm.  No murmurs.  ABDOMEN:  Soft.  No palpable flank mass.  GENITOURINARY:  Bladder was distended up to the level of the umbilicus.  The penis and testes are normal.  There is a small left hydrocele.   COURSE IN THE HOSPITAL:  Admission labs:  CBC:  WBC 14.7, hemoglobin  15.6, hematocrit 45.9, BUN was elevated at 64, creatinine 5.11.  Urine  culture and sensitivity revealed insignificant growth.  Urinalysis:  Blood large,  nitrate negative, leukocyte esterase trace, RBCs too  numerous to count.   The patient was admitted to the hospital and treated with IV fluids.  With catheter drainage, his renal function improved.  He had good urine  output and the BUN was 35, creatinine 2.36 on the second day of  hospitalization.  The patient was able to eat well.  He continued to  make progress with improving renal function.  On August 30, 2006, his  renal function was normal, BUN 23, creatinine 1.68.   He was sent home with a Foley catheter.  He was advised to continue the  Flomax.  He was also given amoxicillin 500 mg p.o. t.i.d. for 7 days.  He will be reviewed in the office in a few days at which time trial of  voiding will be done.   The patient has undergone balloon dilation and stent placement on the  left side.  Stent removal  will be done as an outpatient.  The patient  was advised to call me for fever, chills, voiding difficulty or  hematuria.  The  condition of the patient at the time of discharge is  stable.      Dennie Maizes, M.D.  Electronically Signed     SK/MEDQ  D:  09/21/2006  T:  09/21/2006  Job:  [id number]   cc:   Mila Homer. Sudie Bailey, M.D.  Fax: [phone]

## 2010-11-29 NOTE — H&P (Signed)
[name], [name] NO.:  [id number]   MEDICAL RECORD NO.:  [id number]          PATIENT TYPE:  AMB   LOCATION:  DAY                           FACILITY:  [facility]   PHYSICIAN:  Dennie Maizes, M.D.   DATE OF BIRTH:  1922/10/17   DATE OF ADMISSION:  10/28/2006  DATE OF DISCHARGE:  LH                              HISTORY & PHYSICAL   CHIEF COMPLAINT:  Urinary retention, benign prostatic hypertrophy,  bladder neck obstruction.   HISTORY OF PRESENT ILLNESS:  This 77 year old male is known to me from  prior evaluation and surgery.  He had acute renal failure associated  with bilateral hydronephrosis and postural obstruction in 2005.  A  transurethral resection of the prostate was done.  He had been doing  well until a few months ago.  He was seen by Dr. Sudie Bailey with urinary  tract infection.  Evaluation revealed left hydronephrosis due to upper  ureteral stricture.  The patient has undergone cystoscopy, left  retrograde pyelogram, and balloon dilation of the ureteral stricture  with stent placement on August 20, 2006.  The patient developed urinary  retention after that.  He was admitted to the hospital with acute renal  failure associated with urinary retention.  He has been on Foley  catheter drainage.  A trial of voiding has been done on several  occasions, and the patient has failed to void.  He is brought to the  short stay center today for transurethral resection of the prostate.  Cystoscopy and left ureteral stent removal will be done at the same  time.   The patient also had evaluation with CT scan of the abdomen.  This  revealed a mass in the left psoas area, possibly due to inflammation.  Further followup with repeat CT scan has been planned.  The patient did  not have any fever, chills, voiding difficulty, or gross hematuria  present.   PAST MEDICAL HISTORY:  1. Acute renal failure.  2. Bilateral hydronephrosis.  3. Status post TURP in January,  2005.  4. History of hypercholesterolemia.  5. Erectile dysfunction.  6. Hypertension.  7. Status post cystoscopy, left retrograde pyelogram, left ureteral      stent placement in February, 2008.   MEDICATIONS:  1. Lipitor 25 mg p.o. daily.  2. Cardura 8 mg, 1 p.o. daily.  3. Cialis p.r.n. impotence.  4. Enteric-coated aspirin 81 mg 1 p.o. daily, which has been stopped      for the surgery.   ALLERGIES:  None.   PHYSICAL EXAMINATION:  HEENT:  Normal.  NECK:  No masses.  LUNGS:  Clear to auscultation.  HEART:  Regular rate and rhythm.  No murmurs.  ABDOMEN:  Soft.  No palpable flank masses.  No CVA tenderness.  Bladder  is not palpable.  GENITOURINARY:  Penis and testes are normal.  There is a small left  hydrocele.  RECTAL:  A 43 gm benign prostate.   IMPRESSION:  Urinary retention, benign prostatic hypertrophy with  bladder neck obstruction, left hydronephrosis, status post balloon  dilation and stent placement.   PLAN:  Cystoscopy,  left ureteral stent removal.  Transurethral resection  of the prostate under anesthesia in short stay center.  I have discussed  with the patient regarding the diagnosis, operative details, alternative  treatments, outcome, possible risks and complications, and he has agreed  for the procedure to be done.      Dennie Maizes, M.D.  Electronically Signed     SK/MEDQ  D:  10/27/2006  T:  10/28/2006  Job:  [id number]   cc:   Mila Homer. Sudie Bailey, M.D.  Fax: [phone]

## 2011-02-12 ENCOUNTER — Ambulatory Visit (HOSPITAL_COMMUNITY)
Admission: RE | Admit: 2011-02-12 | Discharge: 2011-02-12 | Disposition: A | Discharge status: Home / Self Care | Payer: Medicare Other | Source: Ambulatory Visit | Attending: Family Medicine | Admitting: Family Medicine

## 2011-02-12 ENCOUNTER — Other Ambulatory Visit (HOSPITAL_COMMUNITY): Payer: Self-pay | Admitting: Family Medicine

## 2011-02-12 DIAGNOSIS — Q619 Cystic kidney disease, unspecified: Secondary | ICD-10-CM | POA: Insufficient documentation

## 2011-02-12 DIAGNOSIS — R319 Hematuria, unspecified: Secondary | ICD-10-CM | POA: Insufficient documentation

## 2011-02-12 DIAGNOSIS — N400 Enlarged prostate without lower urinary tract symptoms: Secondary | ICD-10-CM | POA: Insufficient documentation

## 2011-02-17 ENCOUNTER — Other Ambulatory Visit (HOSPITAL_COMMUNITY): Payer: Self-pay | Admitting: Urology

## 2011-02-21 ENCOUNTER — Ambulatory Visit (HOSPITAL_COMMUNITY)
Admission: RE | Admit: 2011-02-21 | Discharge: 2011-02-21 | Disposition: A | Discharge status: Home / Self Care | Payer: Medicare Other | Source: Ambulatory Visit | Attending: Urology | Admitting: Urology

## 2011-02-21 DIAGNOSIS — R319 Hematuria, unspecified: Secondary | ICD-10-CM | POA: Insufficient documentation

## 2011-02-21 DIAGNOSIS — K5730 Diverticulosis of large intestine without perforation or abscess without bleeding: Secondary | ICD-10-CM | POA: Insufficient documentation

## 2011-02-21 DIAGNOSIS — R1032 Left lower quadrant pain: Secondary | ICD-10-CM | POA: Insufficient documentation

## 2011-02-21 DIAGNOSIS — Q619 Cystic kidney disease, unspecified: Secondary | ICD-10-CM | POA: Insufficient documentation

## 2011-03-13 ENCOUNTER — Other Ambulatory Visit (HOSPITAL_COMMUNITY): Payer: Self-pay | Admitting: Urology

## 2011-03-13 DIAGNOSIS — C61 Malignant neoplasm of prostate: Secondary | ICD-10-CM

## 2011-03-18 ENCOUNTER — Encounter (HOSPITAL_COMMUNITY): Payer: Self-pay

## 2011-03-18 ENCOUNTER — Encounter (HOSPITAL_COMMUNITY)
Admission: RE | Admit: 2011-03-18 | Discharge: 2011-03-18 | Disposition: A | Discharge status: Home / Self Care | Payer: Medicare Other | Source: Ambulatory Visit | Attending: Urology | Admitting: Urology

## 2011-03-18 ENCOUNTER — Ambulatory Visit (HOSPITAL_COMMUNITY)
Admission: RE | Admit: 2011-03-18 | Discharge: 2011-03-18 | Disposition: A | Discharge status: Home / Self Care | Payer: Medicare Other | Source: Ambulatory Visit | Attending: Urology | Admitting: Urology

## 2011-03-18 ENCOUNTER — Other Ambulatory Visit (HOSPITAL_COMMUNITY): Payer: Self-pay | Admitting: Urology

## 2011-03-18 DIAGNOSIS — C61 Malignant neoplasm of prostate: Secondary | ICD-10-CM | POA: Insufficient documentation

## 2011-03-18 HISTORY — DX: Malignant (primary) neoplasm, unspecified: C80.1

## 2011-03-18 MED ORDER — TECHNETIUM TC 99M MEDRONATE IV KIT
25.0000 | PACK | Freq: Once | INTRAVENOUS | Status: AC | PRN
  Administered 2011-03-18: 25 via INTRAVENOUS

## 2012-01-20 ENCOUNTER — Encounter (HOSPITAL_COMMUNITY): Payer: Self-pay | Admitting: Pharmacy Technician

## 2012-01-26 NOTE — Patient Instructions (Signed)
Your procedure is scheduled on:  Tuesday, 02/03/12  Report to [facility] at    0730  AM.  Call this number if you have problems the morning of surgery: [phone]   Remember:   Do not eat or drink   After Midnight.  Take these medicines the morning of surgery with A SIP OF WATER: none   Do not wear jewelry, make-up or nail polish.  Do not wear lotions, powders, or perfumes. You may wear deodorant.  Do not bring valuables to the hospital.  Contacts, dentures or bridgework may not be worn into surgery.     Patients discharged the day of surgery will not be allowed to drive home.  Name and phone number of your driver: driver  Special Instructions: Use eye drops as directed.   Please read over the following fact sheets that you were given: Pain Booklet, Anesthesia Post-op Instructions and Care and Recovery After Surgery    Cataract Surgery  A cataract is a clouding of the lens of the eye. When a lens becomes cloudy, vision is reduced based on the degree and nature of the clouding. Surgery may be needed to improve vision. Surgery removes the cloudy lens and usually replaces it with a substitute lens (intraocular lens, IOL). LET YOUR EYE DOCTOR KNOW ABOUT:  Allergies to food or medicine.   Medicines taken including herbs, eyedrops, over-the-counter medicines, and creams.   Use of steroids (by mouth or creams).   Previous problems with anesthetics or numbing medicine.   History of bleeding problems or blood clots.   Previous surgery.   Other health problems, including diabetes and kidney problems.   Possibility of pregnancy, if this applies.  RISKS AND COMPLICATIONS  Infection.   Inflammation of the eyeball (endophthalmitis) that can spread to both eyes (sympathetic ophthalmia).   Poor wound healing.   If an IOL is inserted, it can later fall out of proper position. This is very uncommon.   Clouding of the part of your eye that holds an IOL in place. This is called an  "after-cataract." These are uncommon, but easily treated.  BEFORE THE PROCEDURE  Do not eat or drink anything except small amounts of water for 8 to 12 before your surgery, or as directed by your caregiver.   Unless you are told otherwise, continue any eyedrops you have been prescribed.   Talk to your primary caregiver about all other medicines that you take (both prescription and non-prescription). In some cases, you may need to stop or change medicines near the time of your surgery. This is most important if you are taking blood-thinning medicine.Do not stop medicines unless you are told to do so.   Arrange for someone to drive you to and from the procedure.   Do not put contact lenses in either eye on the day of your surgery.  PROCEDURE There is more than one method for safely removing a cataract. Your doctor can explain the differences and help determine which is best for you. Phacoemulsification surgery is the most common form of cataract surgery.  An injection is given behind the eye or eyedrops are given to make this a painless procedure.   A small cut (incision) is made on the edge of the clear, dome-shaped surface that covers the front of the eye (cornea).   A tiny probe is painlessly inserted into the eye. This device gives off ultrasound waves that soften and break up the cloudy center of the lens. This makes it easier for  the cloudy lens to be removed by suction.   An IOL may be implanted.   The normal lens of the eye is covered by a clear capsule. Part of that capsule is intentionally left in the eye to support the IOL.   Your surgeon may or may not use stitches to close the incision.  There are other forms of cataract surgery that require a larger incision and stiches to close the eye. This approach is taken in cases where the doctor feels that the cataract cannot be easily removed using phacoemulsification. AFTER THE PROCEDURE  When an IOL is implanted, it does not need  care. It becomes a permanent part of your eye and cannot be seen or felt.   Your doctor will schedule follow-up exams to check on your progress.   Review your other medicines with your doctor to see which can be resumed after surgery.   Use eyedrops or take medicine as prescribed by your doctor.  Document Released: 06/19/2011 Document Reviewed: 06/16/2011 [facility] Patient Information 2012 [place], [place].  PATIENT INSTRUCTIONS POST-ANESTHESIA  IMMEDIATELY FOLLOWING SURGERY:  Do not drive or operate machinery for the first twenty four hours after surgery.  Do not make any important decisions for twenty four hours after surgery or while taking narcotic pain medications or sedatives.  If you develop intractable nausea and vomiting or a severe headache please notify your doctor immediately.  FOLLOW-UP:  Please make an appointment with your surgeon as instructed. You do not need to follow up with anesthesia unless specifically instructed to do so.  WOUND CARE INSTRUCTIONS (if applicable):  Keep a dry clean dressing on the anesthesia/puncture wound site if there is drainage.  Once the wound has quit draining you may leave it open to air.  Generally you should leave the bandage intact for twenty four hours unless there is drainage.  If the epidural site drains for more than 36-48 hours please call the anesthesia department.  QUESTIONS?:  Please feel free to call your physician or the hospital operator if you have any questions, and they will be happy to assist you.

## 2012-01-27 ENCOUNTER — Other Ambulatory Visit: Payer: Self-pay

## 2012-01-27 ENCOUNTER — Encounter (HOSPITAL_COMMUNITY)
Admission: RE | Admit: 2012-01-27 | Discharge: 2012-01-27 | Disposition: A | Discharge status: Home / Self Care | Payer: Medicare Other | Source: Ambulatory Visit | Attending: Ophthalmology | Admitting: Ophthalmology

## 2012-01-27 ENCOUNTER — Encounter (HOSPITAL_COMMUNITY): Payer: Self-pay

## 2012-01-27 LAB — BASIC METABOLIC PANEL
CO2: 29 mEq/L (ref 19–32)
Calcium: 9.8 mg/dL (ref 8.4–10.5)
Chloride: 103 mEq/L (ref 96–112)
Glucose, Bld: 94 mg/dL (ref 70–99)
Sodium: 141 mEq/L (ref 135–145)

## 2012-02-02 MED ORDER — FLURBIPROFEN SODIUM 0.03 % OP SOLN
OPHTHALMIC | Status: AC
  Filled 2012-02-02: qty 2.5

## 2012-02-02 MED ORDER — CYCLOPENTOLATE-PHENYLEPHRINE 0.2-1 % OP SOLN
OPHTHALMIC | Status: AC
  Filled 2012-02-02: qty 2

## 2012-02-02 MED ORDER — PHENYLEPHRINE HCL 2.5 % OP SOLN
OPHTHALMIC | Status: AC
  Filled 2012-02-02: qty 2

## 2012-02-02 MED ORDER — LIDOCAINE HCL 3.5 % OP GEL: 1.0000 "application " | Freq: Once | OPHTHALMIC | Status: DC

## 2012-02-02 MED ORDER — TETRACAINE HCL 0.5 % OP SOLN
OPHTHALMIC | Status: AC
  Filled 2012-02-02: qty 2

## 2012-02-03 ENCOUNTER — Ambulatory Visit (HOSPITAL_COMMUNITY)
Admission: RE | Admit: 2012-02-03 | Discharge: 2012-02-03 | Disposition: A | Discharge status: Home / Self Care | Payer: Medicare Other | Source: Ambulatory Visit | Attending: Ophthalmology | Admitting: Ophthalmology

## 2012-02-03 ENCOUNTER — Encounter (HOSPITAL_COMMUNITY): Payer: Self-pay | Admitting: Anesthesiology

## 2012-02-03 ENCOUNTER — Encounter (HOSPITAL_COMMUNITY)
Admission: RE | Disposition: A | Discharge status: Home / Self Care | Payer: Self-pay | Source: Ambulatory Visit | Attending: Ophthalmology

## 2012-02-03 ENCOUNTER — Encounter (HOSPITAL_COMMUNITY): Payer: Self-pay | Admitting: *Deleted

## 2012-02-03 ENCOUNTER — Ambulatory Visit (HOSPITAL_COMMUNITY): Payer: Medicare Other | Admitting: Anesthesiology

## 2012-02-03 DIAGNOSIS — H2510 Age-related nuclear cataract, unspecified eye: Secondary | ICD-10-CM | POA: Insufficient documentation

## 2012-02-03 DIAGNOSIS — Z01810 Encounter for preprocedural cardiovascular examination: Secondary | ICD-10-CM | POA: Insufficient documentation

## 2012-02-03 DIAGNOSIS — Z01812 Encounter for preprocedural laboratory examination: Secondary | ICD-10-CM | POA: Insufficient documentation

## 2012-02-03 HISTORY — PX: CATARACT EXTRACTION W/PHACO: SHX586

## 2012-02-03 SURGERY — PHACOEMULSIFICATION, CATARACT, WITH IOL INSERTION
Anesthesia: Monitor Anesthesia Care | Site: Eye | Laterality: Left | Wound class: Clean

## 2012-02-03 MED ORDER — FLURBIPROFEN SODIUM 0.03 % OP SOLN
1.0000 [drp] | OPHTHALMIC | Status: AC
  Administered 2012-02-03 (×3): 1 [drp] via OPHTHALMIC

## 2012-02-03 MED ORDER — EPINEPHRINE HCL 1 MG/ML IJ SOLN
INTRAOCULAR | Status: DC | PRN
  Administered 2012-02-03: 09:00:00

## 2012-02-03 MED ORDER — LACTATED RINGERS IV SOLN
INTRAVENOUS | Status: DC
  Administered 2012-02-03: 09:00:00 via INTRAVENOUS

## 2012-02-03 MED ORDER — PHENYLEPHRINE HCL 2.5 % OP SOLN
1.0000 [drp] | OPHTHALMIC | Status: AC
  Administered 2012-02-03 (×3): 1 [drp] via OPHTHALMIC

## 2012-02-03 MED ORDER — TETRACAINE HCL 0.5 % OP SOLN
1.0000 [drp] | OPHTHALMIC | Status: AC
  Administered 2012-02-03 (×3): 1 [drp] via OPHTHALMIC

## 2012-02-03 MED ORDER — EPINEPHRINE HCL 1 MG/ML IJ SOLN
INTRAMUSCULAR | Status: AC
  Filled 2012-02-03: qty 1

## 2012-02-03 MED ORDER — BSS IO SOLN
INTRAOCULAR | Status: DC | PRN
  Administered 2012-02-03: 15 mL via INTRAOCULAR

## 2012-02-03 MED ORDER — CYCLOPENTOLATE-PHENYLEPHRINE 0.2-1 % OP SOLN
1.0000 [drp] | OPHTHALMIC | Status: AC
  Administered 2012-02-03 (×3): 1 [drp] via OPHTHALMIC

## 2012-02-03 MED ORDER — MIDAZOLAM HCL 2 MG/2ML IJ SOLN
INTRAMUSCULAR | Status: AC
  Filled 2012-02-03: qty 2

## 2012-02-03 MED ORDER — PROVISC 10 MG/ML IO SOLN
INTRAOCULAR | Status: DC | PRN
  Administered 2012-02-03: 8.5 mg via INTRAOCULAR

## 2012-02-03 MED ORDER — MIDAZOLAM HCL 2 MG/2ML IJ SOLN
1.0000 mg | INTRAMUSCULAR | Status: DC | PRN
  Administered 2012-02-03: 2 mg via INTRAVENOUS

## 2012-02-03 SURGICAL SUPPLY — 23 items
CAPSULAR TENSION RING-AMO (OPHTHALMIC RELATED) IMPLANT
CLOTH BEACON ORANGE TIMEOUT ST (SAFETY) ×1 IMPLANT
EYE SHIELD UNIVERSAL CLEAR (GAUZE/BANDAGES/DRESSINGS) ×1 IMPLANT
GLOVE BIO SURGEON STRL SZ 6.5 (GLOVE) IMPLANT
GLOVE ECLIPSE 6.5 STRL STRAW (GLOVE) ×1 IMPLANT
GLOVE ECLIPSE 7.0 STRL STRAW (GLOVE) IMPLANT
GLOVE EXAM NITRILE LRG STRL (GLOVE) IMPLANT
GLOVE EXAM NITRILE MD LF STRL (GLOVE) ×1 IMPLANT
GLOVE SKINSENSE NS SZ6.5 (GLOVE)
GLOVE SKINSENSE STRL SZ6.5 (GLOVE) IMPLANT
HEALON 5 0.6 ML (INTRAOCULAR LENS) IMPLANT
KIT VITRECTOMY (OPHTHALMIC RELATED) IMPLANT
PAD ARMBOARD 7.5X6 YLW CONV (MISCELLANEOUS) ×1 IMPLANT
PROC W NO LENS (INTRAOCULAR LENS)
PROC W SPEC LENS (INTRAOCULAR LENS)
PROCESS W NO LENS (INTRAOCULAR LENS) IMPLANT
PROCESS W SPEC LENS (INTRAOCULAR LENS) IMPLANT
RING MALYGIN (MISCELLANEOUS) IMPLANT
SIGHTPATH CAT PROC W REG LENS (Ophthalmic Related) ×2 IMPLANT
TAPE SURG TRANSPORE 1 IN (GAUZE/BANDAGES/DRESSINGS) IMPLANT
TAPE SURGICAL TRANSPORE 1 IN (GAUZE/BANDAGES/DRESSINGS) ×1
VISCOELASTIC ADDITIONAL (OPHTHALMIC RELATED) IMPLANT
WATER STERILE IRR 250ML POUR (IV SOLUTION) ×1 IMPLANT

## 2012-02-03 NOTE — Anesthesia Postprocedure Evaluation (Signed)
  Anesthesia Post-op Note  Patient: [name]  Procedure(s) Performed: Procedure(s) (LRB): CATARACT EXTRACTION PHACO AND INTRAOCULAR LENS PLACEMENT (IOC) (Left)  Patient Location: PACU and Short Stay  Anesthesia Type: MAC  Level of Consciousness: awake, alert  and oriented  Airway and Oxygen Therapy: Patient Spontanous Breathing  Post-op Pain: none  Post-op Assessment: Post-op Vital signs reviewed, Patient's Cardiovascular Status Stable, Respiratory Function Stable, Patent Airway and No signs of Nausea or vomiting  Post-op Vital Signs: Reviewed and stable  Complications: No apparent anesthesia complications

## 2012-02-03 NOTE — H&P (Signed)
The patient was re examined and there is no change in the patients condition since the original H and P. 

## 2012-02-03 NOTE — Transfer of Care (Signed)
Immediate Anesthesia Transfer of Care Note  Patient: [name]  Procedure(s) Performed: Procedure(s) (LRB): CATARACT EXTRACTION PHACO AND INTRAOCULAR LENS PLACEMENT (IOC) (Left)  Patient Location: PACU and Short Stay  Anesthesia Type: MAC  Level of Consciousness: awake, alert  and oriented  Airway & Oxygen Therapy: Patient Spontanous Breathing  Post-op Assessment: Report given to PACU RN  Post vital signs: Reviewed and stable  Complications: No apparent anesthesia complications

## 2012-02-03 NOTE — Op Note (Signed)
Patient brought to the operating room and prepped and draped in the usual manner.  Lid speculum inserted in left eye.  Stab incision made at the twelve o'clock position.  Provisc instilled in the anterior chamber.   A 2.4 mm. Stab incision was made temporally.  An anterior capsulotomy was done with a bent 25 gauge needle.  The nucleus was hydrodissected.  The Phaco tip was inserted in the anterior chamber and the nucleus was emulsified.  CDE was 12.47.  The cortical material was then removed with the I and A tip.  Posterior capsule was the polished.  The anterior chamber was deepened with Provisc.  A 18.0 Diopter Rayner 570C IOL was then inserted in the capsular bag.  Provisc was then removed with the I and A tip.  The wound was then hydrated.  Patient sent to the Recovery Room in good condition with follow up in my office.

## 2012-02-03 NOTE — Anesthesia Preprocedure Evaluation (Addendum)
Anesthesia Evaluation  Patient identified by MRN, date of birth, ID band Patient awake    Reviewed: Allergy & Precautions, H&P , NPO status , Patient's Chart, lab work & pertinent test results  Airway Mallampati: II      Dental  (+) Teeth Intact   Pulmonary Current Smoker,  breath sounds clear to auscultation        Cardiovascular negative cardio ROS  Rhythm:Regular     Neuro/Psych    GI/Hepatic   Endo/Other    Renal/GU      Musculoskeletal   Abdominal   Peds  Hematology   Anesthesia Other Findings Prostate Cancer...radium implants   Reproductive/Obstetrics                          Anesthesia Physical Anesthesia Plan  ASA: III  Anesthesia Plan: MAC   Post-op Pain Management:    Induction: Intravenous  Airway Management Planned: Nasal Cannula  Additional Equipment:   Intra-op Plan:   Post-operative Plan:   Informed Consent: I have reviewed the patients History and Physical, chart, labs and discussed the procedure including the risks, benefits and alternatives for the proposed anesthesia with the patient or authorized representative who has indicated his/her understanding and acceptance.     Plan Discussed with:   Anesthesia Plan Comments:         Anesthesia Quick Evaluation

## 2012-02-06 ENCOUNTER — Encounter (HOSPITAL_COMMUNITY): Payer: Self-pay | Admitting: Ophthalmology

## 2012-02-18 ENCOUNTER — Other Ambulatory Visit (HOSPITAL_COMMUNITY): Payer: Self-pay | Admitting: Urology

## 2012-02-18 DIAGNOSIS — N4289 Other specified disorders of prostate: Secondary | ICD-10-CM

## 2012-02-18 DIAGNOSIS — C61 Malignant neoplasm of prostate: Secondary | ICD-10-CM

## 2012-02-24 ENCOUNTER — Other Ambulatory Visit (HOSPITAL_COMMUNITY): Payer: Medicare Other

## 2012-02-25 ENCOUNTER — Ambulatory Visit (HOSPITAL_COMMUNITY)
Admission: RE | Admit: 2012-02-25 | Discharge: 2012-02-25 | Disposition: A | Discharge status: Home / Self Care | Payer: Medicare Other | Source: Ambulatory Visit | Attending: Urology | Admitting: Urology

## 2012-02-25 ENCOUNTER — Other Ambulatory Visit (HOSPITAL_COMMUNITY): Payer: Self-pay | Admitting: Urology

## 2012-02-25 DIAGNOSIS — N4289 Other specified disorders of prostate: Secondary | ICD-10-CM

## 2012-02-25 DIAGNOSIS — C61 Malignant neoplasm of prostate: Secondary | ICD-10-CM

## 2012-02-25 DIAGNOSIS — N433 Hydrocele, unspecified: Secondary | ICD-10-CM | POA: Insufficient documentation

## 2012-02-25 DIAGNOSIS — K4020 Bilateral inguinal hernia, without obstruction or gangrene, not specified as recurrent: Secondary | ICD-10-CM | POA: Insufficient documentation

## 2012-02-25 MED ORDER — IOHEXOL 300 MG/ML  SOLN
100.0000 mL | Freq: Once | INTRAMUSCULAR | Status: AC | PRN
  Administered 2012-02-25: 100 mL via INTRAVENOUS

## 2012-02-25 NOTE — Progress Notes (Signed)
IV started in left antecubital with 22g angiocath.

## 2014-06-27 ENCOUNTER — Ambulatory Visit (INDEPENDENT_AMBULATORY_CARE_PROVIDER_SITE_OTHER): Payer: Commercial Managed Care - HMO | Admitting: Urology

## 2014-10-03 ENCOUNTER — Ambulatory Visit (INDEPENDENT_AMBULATORY_CARE_PROVIDER_SITE_OTHER): Payer: Commercial Managed Care - HMO | Admitting: Urology

## 2014-10-03 DIAGNOSIS — N3281 Overactive bladder: Secondary | ICD-10-CM

## 2014-10-03 DIAGNOSIS — C61 Malignant neoplasm of prostate: Secondary | ICD-10-CM | POA: Diagnosis not present

## 2015-04-03 ENCOUNTER — Ambulatory Visit (INDEPENDENT_AMBULATORY_CARE_PROVIDER_SITE_OTHER): Payer: Commercial Managed Care - HMO | Admitting: Urology

## 2015-04-03 DIAGNOSIS — C61 Malignant neoplasm of prostate: Secondary | ICD-10-CM | POA: Diagnosis not present

## 2015-09-18 ENCOUNTER — Ambulatory Visit (INDEPENDENT_AMBULATORY_CARE_PROVIDER_SITE_OTHER): Payer: Medicare HMO | Admitting: Urology

## 2015-09-18 DIAGNOSIS — C61 Malignant neoplasm of prostate: Secondary | ICD-10-CM

## 2015-12-18 ENCOUNTER — Emergency Department (HOSPITAL_COMMUNITY): Payer: Medicare HMO

## 2015-12-18 ENCOUNTER — Encounter (HOSPITAL_COMMUNITY): Payer: Self-pay | Admitting: Emergency Medicine

## 2015-12-18 ENCOUNTER — Emergency Department (HOSPITAL_COMMUNITY)
Admission: EM | Admit: 2015-12-18 | Discharge: 2015-12-18 | Disposition: A | Discharge status: Home / Self Care | Payer: Medicare HMO | Attending: Emergency Medicine | Admitting: Emergency Medicine

## 2015-12-18 DIAGNOSIS — Z8546 Personal history of malignant neoplasm of prostate: Secondary | ICD-10-CM | POA: Insufficient documentation

## 2015-12-18 DIAGNOSIS — E860 Dehydration: Secondary | ICD-10-CM | POA: Diagnosis not present

## 2015-12-18 DIAGNOSIS — E875 Hyperkalemia: Secondary | ICD-10-CM | POA: Diagnosis not present

## 2015-12-18 DIAGNOSIS — F17210 Nicotine dependence, cigarettes, uncomplicated: Secondary | ICD-10-CM | POA: Insufficient documentation

## 2015-12-18 DIAGNOSIS — R627 Adult failure to thrive: Secondary | ICD-10-CM | POA: Diagnosis present

## 2015-12-18 LAB — HEPATIC FUNCTION PANEL
ALK PHOS: 80 U/L (ref 38–126)
ALT: 15 U/L — ABNORMAL LOW (ref 17–63)
AST: 21 U/L (ref 15–41)
Albumin: 3.9 g/dL (ref 3.5–5.0)
BILIRUBIN DIRECT: 0.1 mg/dL (ref 0.1–0.5)
BILIRUBIN INDIRECT: 0.3 mg/dL (ref 0.3–0.9)
BILIRUBIN TOTAL: 0.4 mg/dL (ref 0.3–1.2)
Total Protein: 7.7 g/dL (ref 6.5–8.1)

## 2015-12-18 LAB — CBC WITH DIFFERENTIAL/PLATELET
BASOS PCT: 0 %
Basophils Absolute: 0 10*3/uL (ref 0.0–0.1)
Eosinophils Absolute: 0 10*3/uL (ref 0.0–0.7)
Eosinophils Relative: 0 %
HEMATOCRIT: 44.2 % (ref 39.0–52.0)
HEMOGLOBIN: 14.7 g/dL (ref 13.0–17.0)
LYMPHS ABS: 0.9 10*3/uL (ref 0.7–4.0)
Lymphocytes Relative: 9 %
MCH: 28 pg (ref 26.0–34.0)
MCHC: 33.3 g/dL (ref 30.0–36.0)
MCV: 84.2 fL (ref 78.0–100.0)
MONO ABS: 0.7 10*3/uL (ref 0.1–1.0)
MONOS PCT: 7 %
NEUTROS ABS: 8 10*3/uL — AB (ref 1.7–7.7)
NEUTROS PCT: 84 %
Platelets: 637 10*3/uL — ABNORMAL HIGH (ref 150–400)
RBC: 5.25 MIL/uL (ref 4.22–5.81)
RDW: 13.2 % (ref 11.5–15.5)
WBC: 9.6 10*3/uL (ref 4.0–10.5)

## 2015-12-18 LAB — BASIC METABOLIC PANEL
Anion gap: 13 (ref 5–15)
BUN: 27 mg/dL — AB (ref 6–20)
CALCIUM: 9.5 mg/dL (ref 8.9–10.3)
CHLORIDE: 97 mmol/L — AB (ref 101–111)
CO2: 26 mmol/L (ref 22–32)
CREATININE: 1.71 mg/dL — AB (ref 0.61–1.24)
GFR calc Af Amer: 39 mL/min — ABNORMAL LOW (ref >60)
GFR calc non Af Amer: 33 mL/min — ABNORMAL LOW (ref >60)
GLUCOSE: 139 mg/dL — AB (ref 65–99)
Potassium: 5.4 mmol/L — ABNORMAL HIGH (ref 3.5–5.1)
Sodium: 136 mmol/L (ref 135–145)

## 2015-12-18 LAB — LIPASE, BLOOD: LIPASE: 19 U/L (ref 11–51)

## 2015-12-18 MED ORDER — SODIUM CHLORIDE 0.9 % IV SOLN
INTRAVENOUS | Status: DC
  Administered 2015-12-18: 21:00:00 via INTRAVENOUS

## 2015-12-18 MED ORDER — SODIUM CHLORIDE 0.9 % IV BOLUS (SEPSIS)
250.0000 mL | Freq: Once | INTRAVENOUS | Status: AC
  Administered 2015-12-18: 250 mL via INTRAVENOUS

## 2015-12-18 NOTE — ED Notes (Addendum)
Pt family reports pt has not been eating/drinking x1 month. Pt family reports pt was sent over from Dr. Vickey Sages office. Pt family reports pt fell a few weeks ago while at church and reports decreased activity level and appetite ever since. Pt denies any pain/d. Pt wife reports pt recently started on ensure.

## 2015-12-18 NOTE — ED Provider Notes (Signed)
CSN: [id number]     Arrival date & time 12/18/15  1640 History   First MD Initiated Contact with Patient 12/18/15 [place]     Chief Complaint  Patient presents with  . Failure To Thrive     (Consider location/radiation/quality/duration/timing/severity/associated sxs/prior Treatment) The history is provided by the patient and the spouse.  82 year old gentleman referred in from primary care office for concerns for failure to thrive. Family reported patient not been eating or drinking for 5 days. Patient has been experiencing some generalized weakness had a fall at church about a week ago. Patient here without any specific complaints.  Past Medical History  Diagnosis Date  . Cancer [facility])     prostate   Past Surgical History  Procedure Laterality Date  . Insertion prostate radiation seed  2012  . Transurethral resection of prostate    . Cataract extraction w/phaco  02/03/2012    Procedure: CATARACT EXTRACTION PHACO AND INTRAOCULAR LENS PLACEMENT (IOC);  Surgeon: Elta Guadeloupe T. Gershon Crane, MD;  Location: [facility];  Service: Ophthalmology;  Laterality: Left;  CDE:12.47   History reviewed. No pertinent family history. Social History  Substance Use Topics  . Smoking status: Current Every Day Smoker -- 0.25 packs/day for 61 years    Types: Cigarettes  . Smokeless tobacco: None  . Alcohol Use: No    Review of Systems  Constitutional: Positive for fatigue. Negative for fever and appetite change.  HENT: Negative for congestion.   Respiratory: Negative for shortness of breath.   Cardiovascular: Negative for chest pain.  Gastrointestinal: Negative for nausea, vomiting, abdominal pain and diarrhea.  Genitourinary: Negative for dysuria.  Skin: Negative for rash.  Neurological: Negative for headaches.  Hematological: Does not bruise/bleed easily.  Psychiatric/Behavioral: Negative for confusion.      Allergies  Review of patient's allergies indicates no known allergies.  Home Medications   Prior  to Admission medications   Medication Sig Start Date End Date Taking? Authorizing Provider  Multiple Vitamin (MULTIVITAMIN WITH MINERALS) TABS Take 1 tablet by mouth daily.   Yes Historical Provider, MD   BP 150/88 mmHg  Pulse 104  Temp(Src) 97.6 F (36.4 C) (Oral)  Resp 16  Ht 5\' 9"  (1.753 m)  Wt 70.308 kg  BMI 22.88 kg/m2  SpO2 100% Physical Exam  Constitutional: He is oriented to person, place, and time. He appears well-developed and well-nourished. No distress.  HENT:  Head: Normocephalic and atraumatic.  Mouth/Throat: Oropharynx is clear and moist.  Eyes: Conjunctivae and EOM are normal. Pupils are equal, round, and reactive to light.  Neck: Normal range of motion. Neck supple.  Cardiovascular: Regular rhythm and normal heart sounds.   Slightly tachycardic.  Pulmonary/Chest: Effort normal and breath sounds normal. No respiratory distress.  Abdominal: Soft. Bowel sounds are normal. There is no tenderness.  Musculoskeletal: Normal range of motion. He exhibits no edema.  Neurological: He is alert and oriented to person, place, and time. No cranial nerve deficit. He exhibits normal muscle tone. Coordination normal.  Skin: Skin is warm. No rash noted.  Nursing note and vitals reviewed.   ED Course  Procedures (including critical care time) Labs Review Labs Reviewed  CBC WITH DIFFERENTIAL/PLATELET - Abnormal; Notable for the following:    Platelets 637 (*)    Neutro Abs 8.0 (*)    All other components within normal limits  BASIC METABOLIC PANEL - Abnormal; Notable for the following:    Potassium 5.4 (*)    Chloride 97 (*)    Glucose, Bld 139 (*)  BUN 27 (*)    Creatinine, Ser 1.71 (*)    GFR calc non Af Amer 33 (*)    GFR calc Af Amer 39 (*)    All other components within normal limits  HEPATIC FUNCTION PANEL - Abnormal; Notable for the following:    ALT 15 (*)    All other components within normal limits  LIPASE, BLOOD   Results for orders placed or performed  during the hospital encounter of 12/18/15  CBC with Differential  Result Value Ref Range   WBC 9.6 4.0 - 10.5 K/uL   RBC 5.25 4.22 - 5.81 MIL/uL   Hemoglobin 14.7 13.0 - 17.0 g/dL   HCT 44.2 39.0 - 52.0 %   MCV 84.2 78.0 - 100.0 fL   MCH 28.0 26.0 - 34.0 pg   MCHC 33.3 30.0 - 36.0 g/dL   RDW 13.2 11.5 - 15.5 %   Platelets 637 (H) 150 - 400 K/uL   Neutrophils Relative % 84 %   Neutro Abs 8.0 (H) 1.7 - 7.7 K/uL   Lymphocytes Relative 9 %   Lymphs Abs 0.9 0.7 - 4.0 K/uL   Monocytes Relative 7 %   Monocytes Absolute 0.7 0.1 - 1.0 K/uL   Eosinophils Relative 0 %   Eosinophils Absolute 0.0 0.0 - 0.7 K/uL   Basophils Relative 0 %   Basophils Absolute 0.0 0.0 - 0.1 K/uL  Basic metabolic panel  Result Value Ref Range   Sodium 136 135 - 145 mmol/L   Potassium 5.4 (H) 3.5 - 5.1 mmol/L   Chloride 97 (L) 101 - 111 mmol/L   CO2 26 22 - 32 mmol/L   Glucose, Bld 139 (H) 65 - 99 mg/dL   BUN 27 (H) 6 - 20 mg/dL   Creatinine, Ser 1.71 (H) 0.61 - 1.24 mg/dL   Calcium 9.5 8.9 - 10.3 mg/dL   GFR calc non Af Amer 33 (L) >60 mL/min   GFR calc Af Amer 39 (L) >60 mL/min   Anion gap 13 5 - 15  Hepatic function panel  Result Value Ref Range   Total Protein 7.7 6.5 - 8.1 g/dL   Albumin 3.9 3.5 - 5.0 g/dL   AST 21 15 - 41 U/L   ALT 15 (L) 17 - 63 U/L   Alkaline Phosphatase 80 38 - 126 U/L   Total Bilirubin 0.4 0.3 - 1.2 mg/dL   Bilirubin, Direct 0.1 0.1 - 0.5 mg/dL   Indirect Bilirubin 0.3 0.3 - 0.9 mg/dL  Lipase, blood  Result Value Ref Range   Lipase 19 11 - 51 U/L    Imaging Review Dg Chest 2 View  12/18/2015  CLINICAL DATA:  Failure to thrive. EXAM: CHEST  2 VIEW COMPARISON:  03/18/2011 FINDINGS: Cardiomediastinal silhouette is normal. Mediastinal contours appear intact. There is no evidence of focal airspace consolidation, pleural effusion or pneumothorax. Osseous structures are without acute abnormality. Stigmata of diffuse idiopathic skeletal hyperostosis of the thoracic spine. Soft  tissues are grossly normal. IMPRESSION: No active cardiopulmonary disease. Electronically Signed   By: Fidela Salisbury M.D.   On: 12/18/2015 20:02   I have personally reviewed and evaluated these images and lab results as part of my medical decision-making.   EKG Interpretation None      MDM   Final diagnoses:  Dehydration  Hyperkalemia    Patient clinically reasonably hydrated. Labs suggested of some mild dehydration prerenal with elevated BUN and creatinine. Slight elevation in the potassium. Patient's chest x-rays negative other labs  are normal but in particular albumin was normal which is very encouraging. Patient nontoxic no acute distress. Patient hydrated here with improvement. We'll have him have potassium recheck with primary care doctor.    Fredia Sorrow, MD 12/18/15 2127

## 2015-12-18 NOTE — ED Notes (Signed)
To radiology

## 2015-12-18 NOTE — ED Notes (Signed)
Unable to establish an IV- Christy CN in to start

## 2015-12-18 NOTE — Discharge Instructions (Signed)
Continue good hydration. Make an appointment follow-up with your doctor to have potassium rechecked. Otherwise workup today without any significant findings. Albumin was normal very encouraging for adequate nutritional intake.

## 2015-12-21 ENCOUNTER — Other Ambulatory Visit (HOSPITAL_COMMUNITY)
Admission: RE | Admit: 2015-12-21 | Discharge: 2015-12-21 | Disposition: A | Discharge status: Home / Self Care | Payer: Medicare HMO | Source: Ambulatory Visit | Attending: Family Medicine | Admitting: Family Medicine

## 2015-12-21 ENCOUNTER — Other Ambulatory Visit (HOSPITAL_COMMUNITY): Payer: Self-pay | Admitting: Family Medicine

## 2015-12-21 ENCOUNTER — Ambulatory Visit (HOSPITAL_COMMUNITY)
Admission: RE | Admit: 2015-12-21 | Discharge: 2015-12-21 | Disposition: A | Discharge status: Home / Self Care | Payer: Medicare HMO | Source: Ambulatory Visit | Attending: Family Medicine | Admitting: Family Medicine

## 2015-12-21 ENCOUNTER — Telehealth: Payer: Self-pay | Admitting: Internal Medicine

## 2015-12-21 DIAGNOSIS — C61 Malignant neoplasm of prostate: Secondary | ICD-10-CM | POA: Diagnosis present

## 2015-12-21 DIAGNOSIS — R140 Abdominal distension (gaseous): Secondary | ICD-10-CM

## 2015-12-21 DIAGNOSIS — Z8546 Personal history of malignant neoplasm of prostate: Secondary | ICD-10-CM | POA: Insufficient documentation

## 2015-12-21 DIAGNOSIS — K598 Other specified functional intestinal disorders: Secondary | ICD-10-CM | POA: Diagnosis not present

## 2015-12-21 DIAGNOSIS — R188 Other ascites: Secondary | ICD-10-CM | POA: Diagnosis not present

## 2015-12-21 DIAGNOSIS — R938 Abnormal findings on diagnostic imaging of other specified body structures: Secondary | ICD-10-CM | POA: Diagnosis not present

## 2015-12-21 LAB — COMPREHENSIVE METABOLIC PANEL
ALBUMIN: 3.9 g/dL (ref 3.5–5.0)
ALK PHOS: 76 U/L (ref 38–126)
ALT: 27 U/L (ref 17–63)
ANION GAP: 10 (ref 5–15)
AST: 34 U/L (ref 15–41)
BILIRUBIN TOTAL: 0.4 mg/dL (ref 0.3–1.2)
BUN: 36 mg/dL — AB (ref 6–20)
CALCIUM: 9.1 mg/dL (ref 8.9–10.3)
CO2: 28 mmol/L (ref 22–32)
CREATININE: 1.45 mg/dL — AB (ref 0.61–1.24)
Chloride: 95 mmol/L — ABNORMAL LOW (ref 101–111)
GFR calc Af Amer: 47 mL/min — ABNORMAL LOW (ref >60)
GFR calc non Af Amer: 41 mL/min — ABNORMAL LOW (ref >60)
GLUCOSE: 113 mg/dL — AB (ref 65–99)
Potassium: 4.4 mmol/L (ref 3.5–5.1)
Sodium: 133 mmol/L — ABNORMAL LOW (ref 135–145)
TOTAL PROTEIN: 7.9 g/dL (ref 6.5–8.1)

## 2015-12-21 LAB — CBC WITH DIFFERENTIAL/PLATELET
BASOS ABS: 0 10*3/uL (ref 0.0–0.1)
BASOS PCT: 0 %
EOS ABS: 0 10*3/uL (ref 0.0–0.7)
EOS PCT: 0 %
HCT: 41 % (ref 39.0–52.0)
Hemoglobin: 13.4 g/dL (ref 13.0–17.0)
Lymphocytes Relative: 11 %
Lymphs Abs: 1.1 10*3/uL (ref 0.7–4.0)
MCH: 27.3 pg (ref 26.0–34.0)
MCHC: 32.7 g/dL (ref 30.0–36.0)
MCV: 83.5 fL (ref 78.0–100.0)
MONO ABS: 0.6 10*3/uL (ref 0.1–1.0)
Monocytes Relative: 6 %
NEUTROS ABS: 8 10*3/uL — AB (ref 1.7–7.7)
Neutrophils Relative %: 83 %
PLATELETS: 630 10*3/uL — AB (ref 150–400)
RBC: 4.91 MIL/uL (ref 4.22–5.81)
RDW: 13.4 % (ref 11.5–15.5)
WBC: 9.7 10*3/uL (ref 4.0–10.5)

## 2015-12-21 LAB — TSH: TSH: 1.348 u[IU]/mL (ref 0.350–4.500)

## 2015-12-21 LAB — PSA: PSA: 0.1 ng/mL (ref 0.00–4.00)

## 2015-12-21 LAB — SEDIMENTATION RATE: SED RATE: 45 mm/h — AB (ref 0–16)

## 2015-12-21 NOTE — Telephone Encounter (Signed)
Dr. Karie Kirks called me about patient this afternoon. Abdominal pain, abnormal CT. Started on Levaquin and metronidazole for diverticulitis. He asked for patient to be seen.  Please arrange for urgent office appointment with extender next week.

## 2015-12-24 NOTE — Telephone Encounter (Signed)
CALLED PATIENT AND WIFE, [place], STATED THAT PATIENT PASSED AWAY Feb 13, 2016.

## 2015-12-27 ENCOUNTER — Ambulatory Visit: Payer: Medicare HMO | Admitting: Gastroenterology

## 2016-01-12 DEATH — deceased

## 2018-01-18 IMAGING — CT CT ABD-PELV W/O CM
2 of 11 series · 10 of 46 positions shown, 16 images · non-contrast
Comparison: 02/25/2012

CLINICAL DATA: Decreased appetite for 1 week, recent fall, initial
encounter

EXAM:
CT ABDOMEN AND PELVIS WITHOUT CONTRAST
TECHNIQUE: Multidetector CT imaging of the abdomen and pelvis was performed
following the standard protocol without IV contrast.

[Series 2: routine abd pel without · axial · non-contrast · 0.65mm/px · z∈[-340,-10]mm · 8 of 86 slices shown, 13 images]
[im 10/86  soft-tissue]
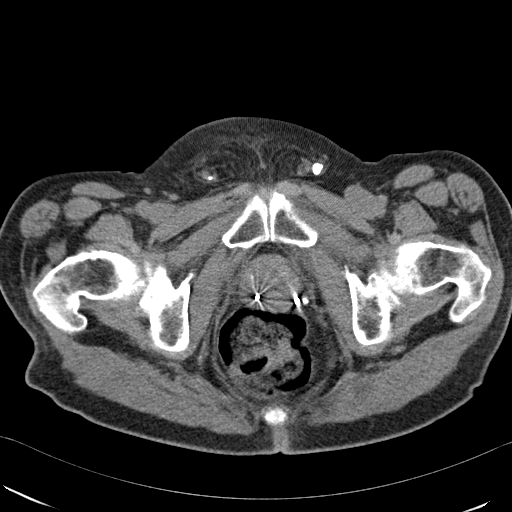
[im 10/86  bone]
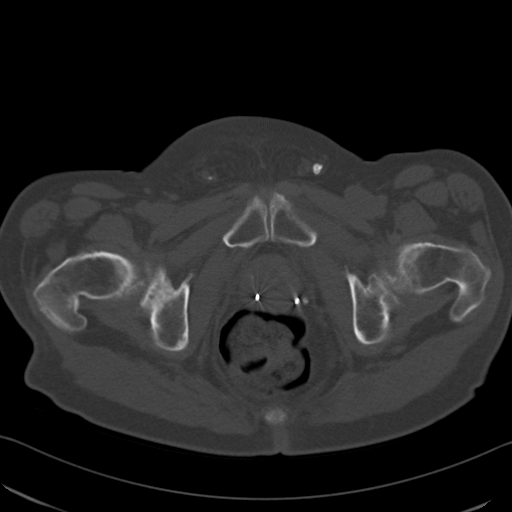
[im 19/86  soft-tissue]
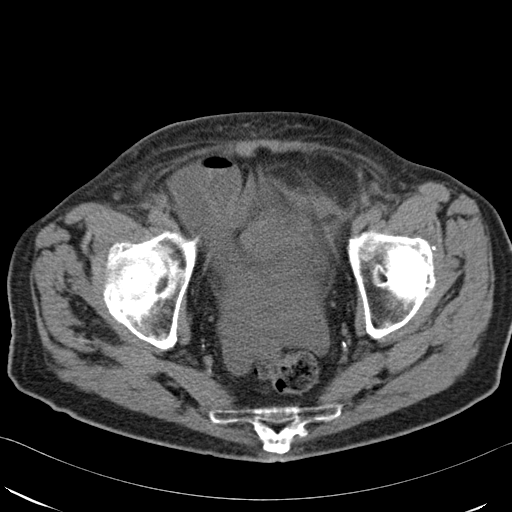
[im 29/86  soft-tissue]
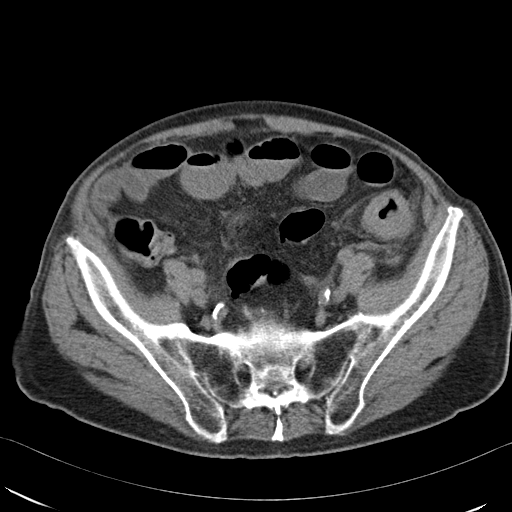
[im 38/86  soft-tissue]
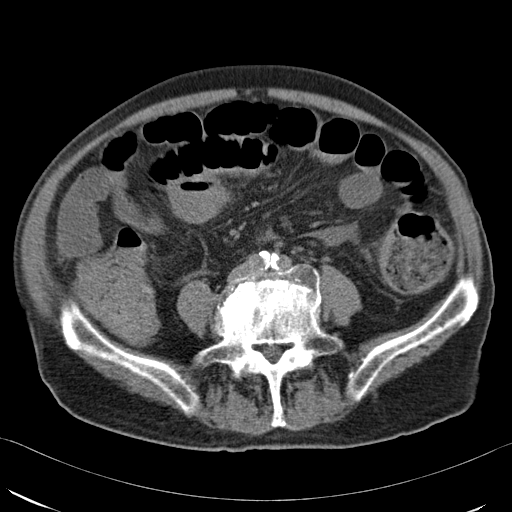
[im 48/86  soft-tissue]
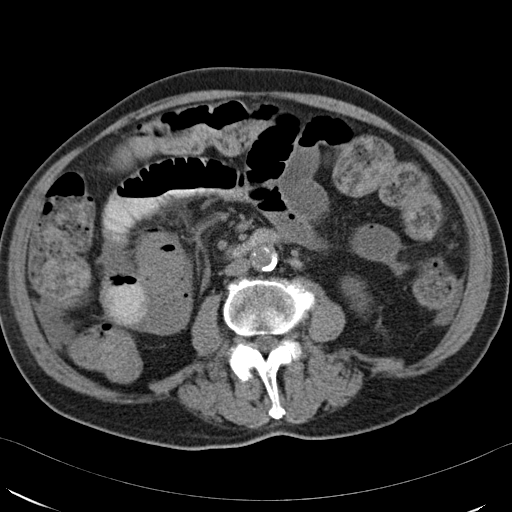
[im 48/86  lung]
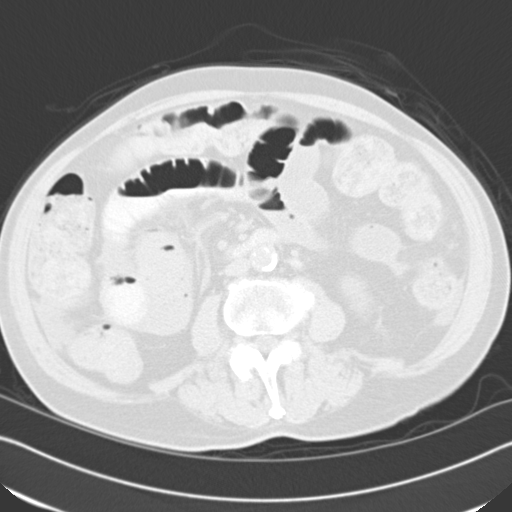
[im 57/86  soft-tissue]
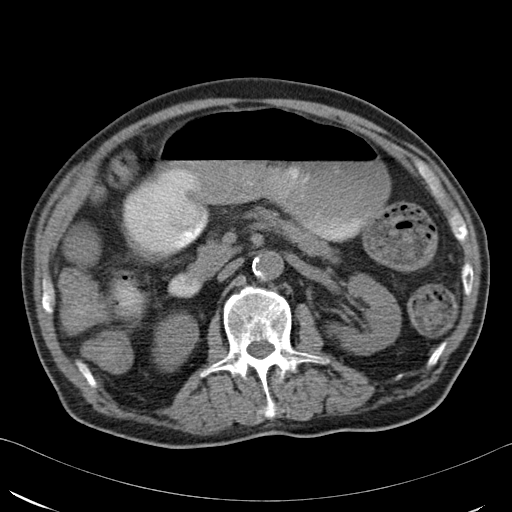
[im 57/86  lung]
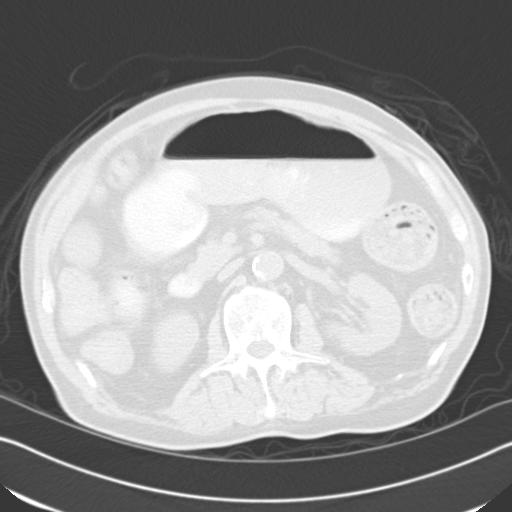
[im 67/86  soft-tissue]
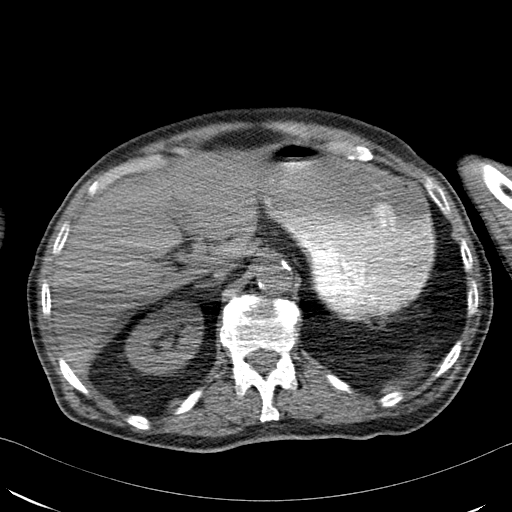
[im 67/86  lung]
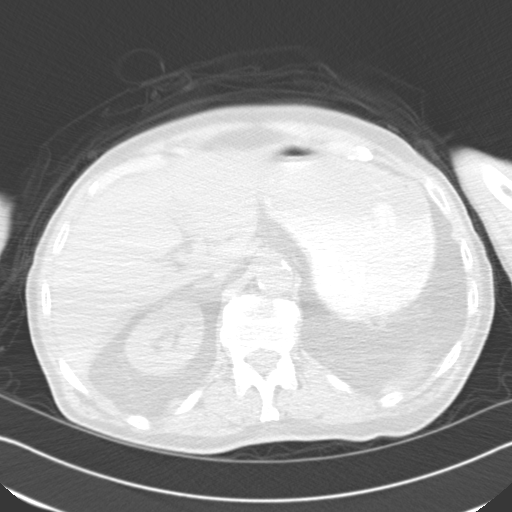
[im 76/86  soft-tissue]
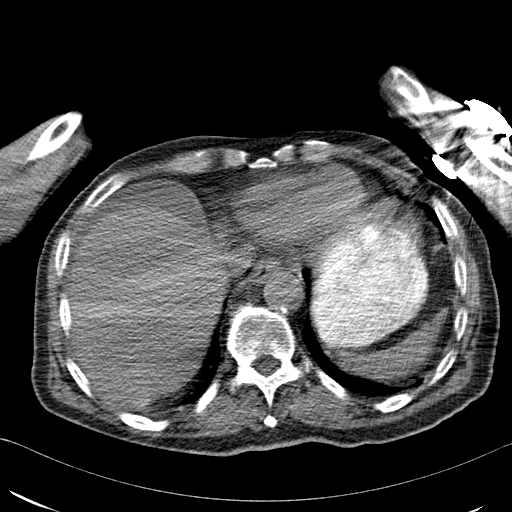
[im 76/86  lung]
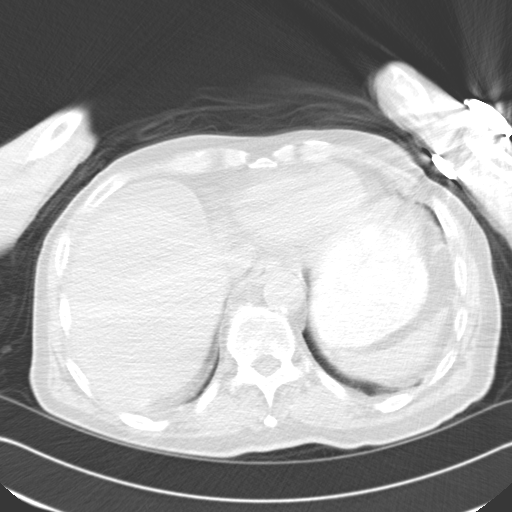

[Series 6: coronal · coronal · 0.38mm/px · 2 of 148 slices shown, 3 images]
[im 50/148  soft-tissue]
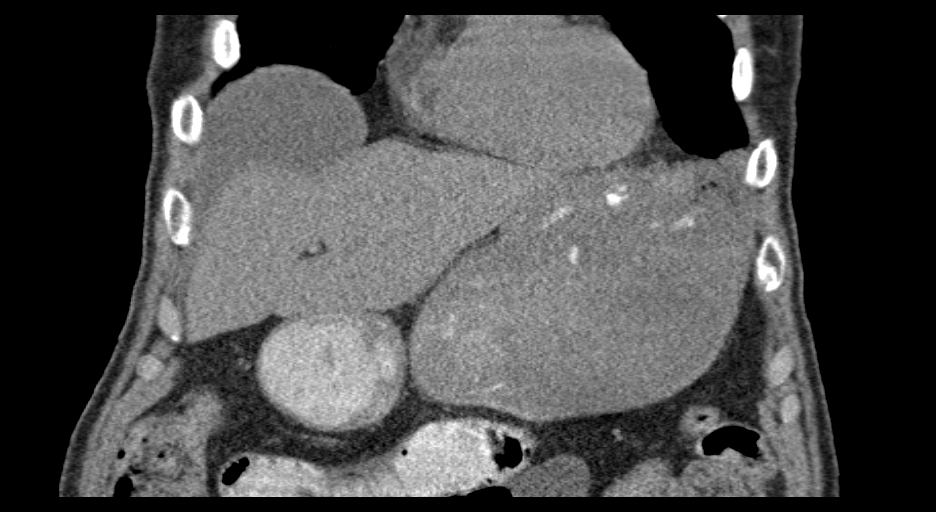
[im 50/148  bone]
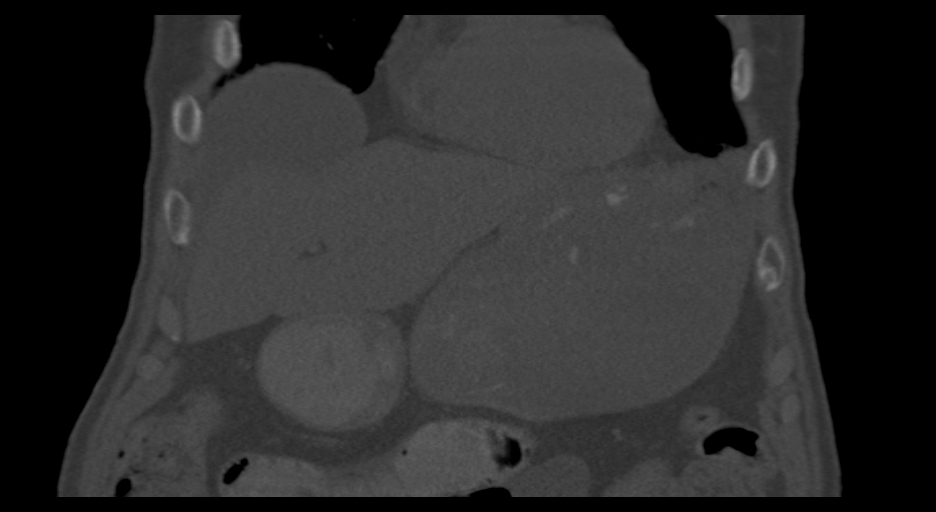
[im 99/148  soft-tissue]
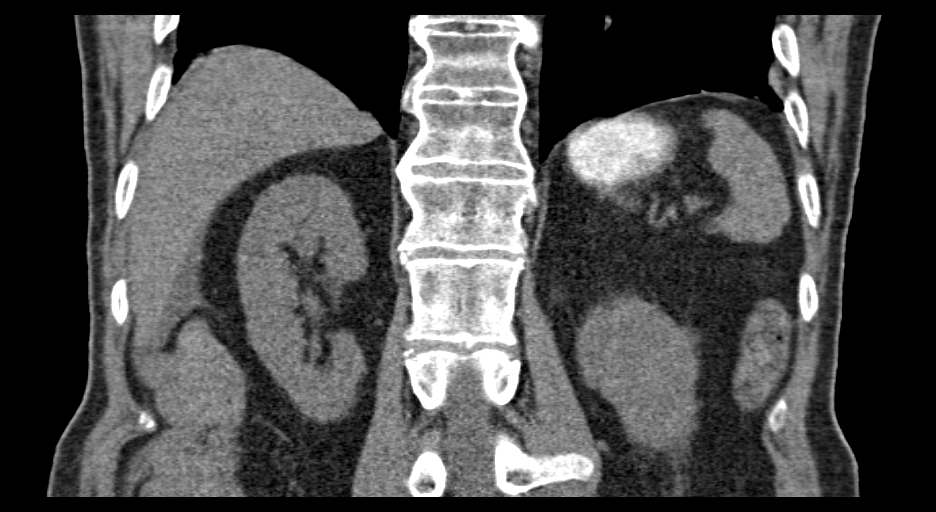

[10 of 46 positions shown; findings below may reference images not displayed]

FINDINGS: Lung bases are free of acute infiltrate or sizable effusion.

The liver, gallbladder, spleen, adrenal glands and pancreas are all
normal in their CT appearance. The kidneys are well visualized and
reveal no renal calculi or obstructive changes. A small amount of
ascites is noted surrounding the liver.

The stomach is well distended. There are multiple loops of dilated
small bowel which extends to the level of the distal ileum. No
definitive transition zone is seen. No mass lesion is noted.

There is considerable circumferential thickening of the descending
colon with surrounding soft tissue abnormality. This could represent
a focal area of diverticulitis although these changes are somewhat
suspicious for underlying neoplasm. Direct visualization or followup
examination is recommended.

The bony structures are within normal limits. Bladder is well
distended. Small bladder diverticulum are noted. Prostate is within
limits. Diffuse aortoiliac calcifications are noted.
IMPRESSION: Circumferential thickening of the distal descending colon with
surrounding inflammatory changes. No significant lymphadenopathy is
noted. These changes may simply represent diverticulitis although
the possibility of an underlying neoplasm deserves consideration as
well. Direct visualization for further workup when clinically able
is recommended.

Small bowel dilatation throughout almost the entire small bowel
without definitive mass lesion or focal transition zone.

Mild ascites.

These results will be called to the ordering clinician or
representative by the Radiologist Assistant, and communication
documented in the PACS or zVision Dashboard.
